# Patient Record
Sex: Female | Born: 1975 | Race: White | Hispanic: No | Marital: Married | Smoking: Never smoker
Health system: Southern US, Community
[De-identification: ages and names within clinical notes are randomized; demographics above are authoritative.]

## PROBLEM LIST (undated history)

## (undated) ENCOUNTER — Inpatient Hospital Stay (HOSPITAL_COMMUNITY): Payer: 59

## (undated) DIAGNOSIS — R61 Generalized hyperhidrosis: Secondary | ICD-10-CM

## (undated) DIAGNOSIS — G5603 Carpal tunnel syndrome, bilateral upper limbs: Secondary | ICD-10-CM

## (undated) DIAGNOSIS — O09529 Supervision of elderly multigravida, unspecified trimester: Secondary | ICD-10-CM

## (undated) DIAGNOSIS — F909 Attention-deficit hyperactivity disorder, unspecified type: Secondary | ICD-10-CM

## (undated) DIAGNOSIS — B009 Herpesviral infection, unspecified: Secondary | ICD-10-CM

## (undated) DIAGNOSIS — T7840XA Allergy, unspecified, initial encounter: Secondary | ICD-10-CM

## (undated) HISTORY — DX: Allergy, unspecified, initial encounter: T78.40XA

## (undated) HISTORY — DX: Herpesviral infection, unspecified: B00.9

## (undated) HISTORY — DX: Attention-deficit hyperactivity disorder, unspecified type: F90.9

## (undated) HISTORY — DX: Generalized hyperhidrosis: R61

## (undated) HISTORY — DX: Supervision of elderly multigravida, unspecified trimester: O09.529

## (undated) HISTORY — PX: RHINOPLASTY: SUR1284

---

## 2003-07-02 ENCOUNTER — Emergency Department (HOSPITAL_COMMUNITY): Admission: EM | Admit: 2003-07-02 | Discharge: 2003-07-02 | Payer: Self-pay | Admitting: Emergency Medicine

## 2003-09-12 ENCOUNTER — Other Ambulatory Visit: Admission: RE | Admit: 2003-09-12 | Discharge: 2003-09-12 | Payer: Self-pay | Admitting: Obstetrics and Gynecology

## 2004-10-06 ENCOUNTER — Other Ambulatory Visit: Admission: RE | Admit: 2004-10-06 | Discharge: 2004-10-06 | Payer: Self-pay | Admitting: Obstetrics and Gynecology

## 2004-10-08 ENCOUNTER — Encounter: Admission: RE | Admit: 2004-10-08 | Discharge: 2004-10-08 | Payer: Self-pay | Admitting: Obstetrics and Gynecology

## 2005-11-17 ENCOUNTER — Ambulatory Visit: Payer: Self-pay | Admitting: Family Medicine

## 2006-02-17 ENCOUNTER — Ambulatory Visit: Payer: Self-pay | Admitting: Family Medicine

## 2006-06-17 ENCOUNTER — Ambulatory Visit: Payer: Self-pay | Admitting: Family Medicine

## 2006-12-29 ENCOUNTER — Ambulatory Visit: Payer: Self-pay | Admitting: Family Medicine

## 2007-04-11 ENCOUNTER — Ambulatory Visit: Payer: Self-pay | Admitting: Family Medicine

## 2007-06-28 ENCOUNTER — Ambulatory Visit: Payer: Self-pay | Admitting: Family Medicine

## 2008-01-30 ENCOUNTER — Ambulatory Visit: Payer: Self-pay | Admitting: Family Medicine

## 2008-08-02 ENCOUNTER — Ambulatory Visit: Payer: Self-pay | Admitting: Family Medicine

## 2009-05-27 ENCOUNTER — Ambulatory Visit: Payer: Self-pay | Admitting: Family Medicine

## 2009-06-20 ENCOUNTER — Emergency Department (HOSPITAL_COMMUNITY): Admission: EM | Admit: 2009-06-20 | Discharge: 2009-06-20 | Payer: Self-pay | Admitting: Emergency Medicine

## 2009-10-23 ENCOUNTER — Ambulatory Visit: Payer: Self-pay | Admitting: Family Medicine

## 2010-04-21 ENCOUNTER — Ambulatory Visit: Payer: Self-pay | Admitting: Family Medicine

## 2010-11-14 ENCOUNTER — Telehealth: Payer: Self-pay | Admitting: Family Medicine

## 2010-11-17 MED ORDER — METHYLPHENIDATE HCL ER (OSM) 36 MG PO TBCR: 36.0000 mg | EXTENDED_RELEASE_TABLET | Freq: Every day | ORAL | Status: DC

## 2010-11-17 MED ORDER — METHYLPHENIDATE HCL ER (LA) 20 MG PO CP24: 20.0000 mg | ORAL_CAPSULE | Freq: Every day | ORAL | Status: DC

## 2010-11-17 MED ORDER — CLONAZEPAM 0.5 MG PO TABS: 0.5000 mg | ORAL_TABLET | Freq: Two times a day (BID) | ORAL | Status: DC

## 2010-11-17 NOTE — Telephone Encounter (Signed)
Her ADD meds plus Klonopin were called in.

## 2011-02-19 ENCOUNTER — Encounter: Payer: Self-pay | Admitting: Family Medicine

## 2011-03-03 ENCOUNTER — Telehealth: Payer: Self-pay | Admitting: *Deleted

## 2011-03-03 NOTE — Telephone Encounter (Signed)
Called pt to inform her she needs an appointment

## 2011-03-03 NOTE — Telephone Encounter (Signed)
She needs a follow-up appointment 

## 2011-03-09 ENCOUNTER — Encounter: Payer: Self-pay | Admitting: Family Medicine

## 2011-03-09 ENCOUNTER — Ambulatory Visit (INDEPENDENT_AMBULATORY_CARE_PROVIDER_SITE_OTHER): Payer: BC Managed Care – PPO | Admitting: Family Medicine

## 2011-03-09 VITALS — BP 100/60 | HR 64 | Wt 144.0 lb

## 2011-03-09 DIAGNOSIS — F909 Attention-deficit hyperactivity disorder, unspecified type: Secondary | ICD-10-CM

## 2011-03-09 DIAGNOSIS — M542 Cervicalgia: Secondary | ICD-10-CM

## 2011-03-09 MED ORDER — METHYLPHENIDATE HCL ER (LA) 20 MG PO CP24: 20.0000 mg | ORAL_CAPSULE | Freq: Every day | ORAL | Status: DC

## 2011-03-09 MED ORDER — METHYLPHENIDATE HCL ER (OSM) 36 MG PO TBCR: 36.0000 mg | EXTENDED_RELEASE_TABLET | Freq: Every day | ORAL | Status: DC

## 2011-03-09 MED ORDER — METHYLPHENIDATE HCL 20 MG PO TABS: 20.0000 mg | ORAL_TABLET | Freq: Every day | ORAL | Status: DC

## 2011-03-09 NOTE — Patient Instructions (Signed)
Check on the Ritalin and Concerta concerning the pregnancy category risk

## 2011-03-09 NOTE — Progress Notes (Signed)
  Subjective:    Patient ID: Alexandria Hanna, female    DOB: 1976-02-01, 35 y.o.   MRN: [id number]  HPI She is here for consult concerning her ADD. She continues to do nicely on her Concerta and then uses Ritalin as needed for later on in the day she needs to concentrate. She also is having left neck pain and into the shoulder. It is not made worse with any particular activities. Head motion makes no difference. No numbness, tingling or weakness. Also this weekend she is going to get married. She did ask questions and in her medication and pregnancy.  Review of Systems     Objective:   Physical Exam Alert and in no distress. Full motion of the neck. No tenderness to palpation of the neck. Normal motor, sensory and DTRs of her arms.       Assessment & Plan:   1. ADD (attention deficit disorder with hyperactivity)   2. Neck pain    Prescriptions written for 90 day supply of Ritalin and Concerta did also recommend heat stretching and massage for her neck.

## 2011-03-10 ENCOUNTER — Telehealth: Payer: Self-pay | Admitting: Family Medicine

## 2011-03-10 ENCOUNTER — Other Ambulatory Visit: Payer: Self-pay

## 2011-03-10 MED ORDER — CLONAZEPAM 0.5 MG PO TABS: 0.5000 mg | ORAL_TABLET | Freq: Two times a day (BID) | ORAL | Status: DC

## 2011-03-10 NOTE — Telephone Encounter (Signed)
Call in the Klonopin

## 2011-03-10 NOTE — Telephone Encounter (Signed)
Ordered med per jcl 

## 2011-07-01 ENCOUNTER — Telehealth: Payer: Self-pay | Admitting: Internal Medicine

## 2011-07-01 MED ORDER — METHYLPHENIDATE HCL 20 MG PO TABS: 20.0000 mg | ORAL_TABLET | Freq: Every day | ORAL | Status: DC

## 2011-07-01 MED ORDER — CLONAZEPAM 0.5 MG PO TABS: 0.5000 mg | ORAL_TABLET | Freq: Two times a day (BID) | ORAL | Status: DC

## 2011-07-01 MED ORDER — METHYLPHENIDATE HCL ER (OSM) 36 MG PO TBCR: 36.0000 mg | EXTENDED_RELEASE_TABLET | ORAL | Status: DC

## 2011-07-01 NOTE — Telephone Encounter (Signed)
Ritalin, Concerta and Klonopin were renewed

## 2011-07-02 ENCOUNTER — Telehealth: Payer: Self-pay | Admitting: Family Medicine

## 2011-07-02 NOTE — Telephone Encounter (Signed)
TSD  

## 2011-10-26 ENCOUNTER — Encounter (HOSPITAL_COMMUNITY): Payer: Self-pay

## 2011-10-26 ENCOUNTER — Encounter (HOSPITAL_COMMUNITY): Admission: AD | Disposition: A | Discharge status: Home / Self Care | Payer: Self-pay | Source: Ambulatory Visit

## 2011-10-26 ENCOUNTER — Inpatient Hospital Stay (HOSPITAL_COMMUNITY)
Admission: AD | Admit: 2011-10-26 | Discharge: 2011-10-26 | Disposition: A | Discharge status: Home / Self Care | Payer: 59 | Source: Ambulatory Visit | Attending: Obstetrics and Gynecology | Admitting: Obstetrics and Gynecology

## 2011-10-26 ENCOUNTER — Encounter (HOSPITAL_COMMUNITY): Payer: Self-pay | Admitting: *Deleted

## 2011-10-26 ENCOUNTER — Inpatient Hospital Stay (HOSPITAL_COMMUNITY): Payer: 59

## 2011-10-26 DIAGNOSIS — F909 Attention-deficit hyperactivity disorder, unspecified type: Secondary | ICD-10-CM

## 2011-10-26 DIAGNOSIS — O039 Complete or unspecified spontaneous abortion without complication: Secondary | ICD-10-CM

## 2011-10-26 DIAGNOSIS — O021 Missed abortion: Secondary | ICD-10-CM | POA: Insufficient documentation

## 2011-10-26 HISTORY — PX: DILATION AND EVACUATION: SHX1459

## 2011-10-26 LAB — CBC
MCHC: 34.9 g/dL (ref 30.0–36.0)
RDW: 11.9 % (ref 11.5–15.5)

## 2011-10-26 LAB — ABO/RH: ABO/RH(D): B NEG

## 2011-10-26 SURGERY — DILATION AND EVACUATION, UTERUS
Anesthesia: General

## 2011-10-26 MED ORDER — OXYCODONE-ACETAMINOPHEN 5-500 MG PO CAPS: 1.0000 | ORAL_CAPSULE | ORAL | Status: AC | PRN

## 2011-10-26 MED ORDER — PROPOFOL 10 MG/ML IV EMUL
INTRAVENOUS | Status: DC | PRN
  Administered 2011-10-26: 20 mg via INTRAVENOUS
  Administered 2011-10-26 (×2): 30 mg via INTRAVENOUS
  Administered 2011-10-26: 20 mg via INTRAVENOUS
  Administered 2011-10-26: 10 mg via INTRAVENOUS
  Administered 2011-10-26: 30 mg via INTRAVENOUS

## 2011-10-26 MED ORDER — KETOROLAC TROMETHAMINE 30 MG/ML IJ SOLN
INTRAMUSCULAR | Status: DC | PRN
  Administered 2011-10-26: 30 mg via INTRAVENOUS

## 2011-10-26 MED ORDER — AMOXICILLIN-POT CLAVULANATE 875-125 MG PO TABS: 1.0000 | ORAL_TABLET | Freq: Two times a day (BID) | ORAL | Status: AC

## 2011-10-26 MED ORDER — ALBUTEROL SULFATE (5 MG/ML) 0.5% IN NEBU: 2.5000 mg | INHALATION_SOLUTION | Freq: Once | RESPIRATORY_TRACT | Status: DC

## 2011-10-26 MED ORDER — CEFAZOLIN SODIUM 1-5 GM-% IV SOLN
1.0000 g | INTRAVENOUS | Status: AC
  Administered 2011-10-26: 1 g via INTRAVENOUS
  Filled 2011-10-26: qty 50

## 2011-10-26 MED ORDER — ALBUTEROL SULFATE HFA 108 (90 BASE) MCG/ACT IN AERS
2.0000 | INHALATION_SPRAY | Freq: Once | RESPIRATORY_TRACT | Status: AC
  Administered 2011-10-26: 2 via RESPIRATORY_TRACT
  Filled 2011-10-26: qty 6.7

## 2011-10-26 MED ORDER — LACTATED RINGERS IV SOLN
INTRAVENOUS | Status: DC
  Administered 2011-10-26: 19:00:00 via INTRAVENOUS

## 2011-10-26 MED ORDER — ONDANSETRON HCL 4 MG/2ML IJ SOLN
INTRAMUSCULAR | Status: DC | PRN
  Administered 2011-10-26: 4 mg via INTRAVENOUS

## 2011-10-26 MED ORDER — MIDAZOLAM HCL 5 MG/5ML IJ SOLN
INTRAMUSCULAR | Status: DC | PRN
  Administered 2011-10-26 (×2): 2 mg via INTRAVENOUS

## 2011-10-26 MED ORDER — LIDOCAINE HCL (CARDIAC) 20 MG/ML IV SOLN
INTRAVENOUS | Status: DC | PRN
  Administered 2011-10-26: 30 mg via INTRAVENOUS

## 2011-10-26 MED ORDER — FENTANYL CITRATE 0.05 MG/ML IJ SOLN
INTRAMUSCULAR | Status: DC | PRN
  Administered 2011-10-26: 100 ug via INTRAVENOUS

## 2011-10-26 MED ORDER — RHO D IMMUNE GLOBULIN 1500 UNIT/2ML IJ SOLN
300.0000 ug | Freq: Once | INTRAMUSCULAR | Status: AC
  Administered 2011-10-26: 300 ug via INTRAMUSCULAR

## 2011-10-26 MED ORDER — CITRIC ACID-SODIUM CITRATE 334-500 MG/5ML PO SOLN
30.0000 mL | Freq: Once | ORAL | Status: AC
  Administered 2011-10-26: 30 mL via ORAL
  Filled 2011-10-26: qty 15

## 2011-10-26 MED ORDER — FAMOTIDINE IN NACL 20-0.9 MG/50ML-% IV SOLN
20.0000 mg | Freq: Once | INTRAVENOUS | Status: AC
  Administered 2011-10-26: 20 mg via INTRAVENOUS
  Filled 2011-10-26: qty 50

## 2011-10-26 MED ORDER — FENTANYL CITRATE 0.05 MG/ML IJ SOLN: 25.0000 ug | INTRAMUSCULAR | Status: DC | PRN

## 2011-10-26 MED ORDER — CHLOROPROCAINE HCL 1 % IJ SOLN
INTRAMUSCULAR | Status: DC | PRN
  Administered 2011-10-26: 18 mL

## 2011-10-26 SURGICAL SUPPLY — 18 items
CLOTH BEACON ORANGE TIMEOUT ST (SAFETY) ×2 IMPLANT
DECANTER SPIKE VIAL GLASS SM (MISCELLANEOUS) ×2 IMPLANT
GLOVE BIO SURGEON STRL SZ7 (GLOVE) ×2 IMPLANT
GOWN PREVENTION PLUS LG XLONG (DISPOSABLE) ×2 IMPLANT
GOWN STRL REIN XL XLG (GOWN DISPOSABLE) ×2 IMPLANT
KIT BERKELEY 1ST TRIMESTER 3/8 (MISCELLANEOUS) ×2 IMPLANT
NDL SPNL 20GX3.5 QUINCKE YW (NEEDLE) ×1 IMPLANT
NEEDLE SPNL 20GX3.5 QUINCKE YW (NEEDLE) ×2 IMPLANT
NS IRRIG 1000ML POUR BTL (IV SOLUTION) ×2 IMPLANT
PACK VAGINAL MINOR WOMEN LF (CUSTOM PROCEDURE TRAY) ×2 IMPLANT
PAD PREP 24X48 CUFFED NSTRL (MISCELLANEOUS) ×2 IMPLANT
SET BERKELEY SUCTION TUBING (SUCTIONS) ×2 IMPLANT
SYR CONTROL 10ML LL (SYRINGE) ×2 IMPLANT
TOWEL OR 17X24 6PK STRL BLUE (TOWEL DISPOSABLE) ×4 IMPLANT
VACURETTE 10 RIGID CVD (CANNULA) IMPLANT
VACURETTE 7MM CVD STRL WRAP (CANNULA) IMPLANT
VACURETTE 8 RIGID CVD (CANNULA) IMPLANT
VACURETTE 9 RIGID CVD (CANNULA) IMPLANT

## 2011-10-26 NOTE — Anesthesia Preprocedure Evaluation (Addendum)
Anesthesia Evaluation  Patient identified by MRN, date of birth, ID band Patient awake    Reviewed: Allergy & Precautions, H&P , Patient's Chart, lab work & pertinent test results, reviewed documented beta blocker date and time   Airway Mallampati: II TM Distance: >3 FB Neck ROM: full    Dental No notable dental hx.    Pulmonary  breath sounds clear to auscultation  Pulmonary exam normal       Cardiovascular Rhythm:regular Rate:Normal     Neuro/Psych PSYCHIATRIC DISORDERS    GI/Hepatic   Endo/Other    Renal/GU      Musculoskeletal   Abdominal   Peds  Hematology   Anesthesia Other Findings   Reproductive/Obstetrics                          Anesthesia Physical Anesthesia Plan  ASA: II  Anesthesia Plan: MAC   Post-op Pain Management:    Induction: Intravenous  Airway Management Planned: Mask, Simple Face Mask and Natural Airway  Additional Equipment:   Intra-op Plan:   Post-operative Plan:   Informed Consent: I have reviewed the patients History and Physical, chart, labs and discussed the procedure including the risks, benefits and alternatives for the proposed anesthesia with the patient or authorized representative who has indicated his/her understanding and acceptance.   Dental Advisory Given  Plan Discussed with: CRNA and Surgeon  Anesthesia Plan Comments: (  Discussed MAC anesthesia, including possible nausea, instrumentation of airway, sore throat,pulmonary aspiration, etc. I asked if the were any outstanding questions, or  concerns before we proceeded. )       Anesthesia Quick Evaluation

## 2011-10-26 NOTE — Brief Op Note (Signed)
10/26/2011  8:28 PM  PATIENT:  Alexandria Hanna  36 y.o. female  PRE-OPERATIVE DIAGNOSIS:  Missed Abortion 9 wks  POST-OPERATIVE DIAGNOSIS:  * No post-op diagnosis entered *  PROCEDURE:  Procedure(s) (LRB): DILATATION AND EVACUATION (N/A)  SURGEON:  Surgeon(s) and Role:    * Juluis Mire, MD - Primary  PHYSICIAN ASSISTANT:   ASSISTANTS: none   ANESTHESIA:   local and IV sedation  EBL:     BLOOD ADMINISTERED:none  DRAINS: none   LOCAL MEDICATIONS USED:  OTHER nesicaine 18 cc  SPECIMEN:  Source of Specimen:  POC  DISPOSITION OF SPECIMEN:  PATHOLOGY  COUNTS:  YES  TOURNIQUET:  * No tourniquets in log *  DICTATION: .Other Dictation: Dictation Number [id number]  PLAN OF CARE: Discharge to home after PACU  PATIENT DISPOSITION:  PACU - hemodynamically stable.   Delay start of Pharmacological VTE agent (>24hrs) due to surgical blood loss or risk of bleeding: no

## 2011-10-26 NOTE — MAU Note (Signed)
Pt went to the doctor today due to bleeding that started Friday at 8pm. Pt states she has bleed throughout the weekend today bleeding being heavier. Pt had ultrasound may 2nd, 9th and today with confirmed no heart rate. Pt was sent home and was told to come to the hospital if bleeding and pain became worse. Now bleeding is smears on perimeum. Pain is a level 8 or 9.

## 2011-10-26 NOTE — MAU Note (Signed)
Warm blankets provided to pt.

## 2011-10-26 NOTE — MAU Note (Signed)
Patient states she is having a miscarriage. Was in the office today and had som POC still in the uterus. Was instructed if bleeding continued and pain got worse may need a D & C. Patient states she has had increasing bleeding with clots and increase in the pain.

## 2011-10-26 NOTE — MAU Note (Signed)
TO OR VIA STRETCHER.

## 2011-10-26 NOTE — Op Note (Signed)
Patient name  Alexandria Hanna, Alexandria Hanna DICTATION#[id number] CSN# [id number]   Juluis Mire, MD 10/26/2011 8:30 PM

## 2011-10-26 NOTE — Anesthesia Postprocedure Evaluation (Signed)
  Anesthesia Post-op Note  Patient: Alexandria Hanna  Procedure(s) Performed: Procedure(s) (LRB): DILATATION AND EVACUATION (N/A)  Patient is awake and responsive. Pain and nausea are reasonably well controlled. Vital signs are stable and clinically acceptable. Oxygen saturation is clinically acceptable. There are no apparent anesthetic complications at this time. Patient is ready for discharge.   

## 2011-10-26 NOTE — Transfer of Care (Signed)
Immediate Anesthesia Transfer of Care Note  Patient: Alexandria Hanna  Procedure(s) Performed: Procedure(s) (LRB): DILATATION AND EVACUATION (N/A)  Patient Location: PACU  Anesthesia Type: MAC  Level of Consciousness: awake, alert , oriented and patient cooperative  Airway & Oxygen Therapy: Patient Spontanous Breathing  Post-op Assessment: Report given to PACU RN and Post -op Vital signs reviewed and stable  Post vital signs: Reviewed and stable  Complications: No apparent anesthesia complications

## 2011-10-26 NOTE — Discharge Instructions (Signed)
Dilation and Curettage or Vacuum Curettage  Care After  Refer to this sheet in the next few weeks. These instructions provide you with general information on caring for yourself after your procedure. Your caregiver may also give you more specific instructions. Your treatment has been planned according to current medical practices, but problems sometimes occur. Call your caregiver if you have any problems or questions after your procedure.  HOME CARE INSTRUCTIONS   · Do not drive for 24 hours.  · Wait 1 week before returning to strenuous activities.  · Take your temperature 2 times a day for 4 days and write it down. Provide these temperatures to your caregiver if you develop a fever.  · Avoid long periods of standing, and do no heavy lifting (more than 10 pounds or 4.5 kg), pushing, or pulling.  · Limit stair climbing to once or twice a day.  · Take rest periods often.  · You may resume your usual diet.  · Drink enough fluids to keep your urine clear or pale yellow.  · You should return to your usual bowel function. If constipation should occur, you may:  · Take a mild laxative with permission from your caregiver.  · Add fruit and bran to your diet.  · Drink more fluids.  · Take showers instead of baths until your caregiver gives you permission to take baths.  · Do not go swimming or use a hot tub until your caregiver gives you permission.  · Try to have someone with you or available to you the first 24 to 48 hours, especially if you had a general anesthetic.  · Do not douche, use tampons, or have intercourse until after your follow-up appointment, or when your caregiver approves.  · Only take over-the-counter or prescription medicines for pain, discomfort, or fever as directed by your caregiver. Do not take aspirin. It can cause bleeding.  · If a prescription was given, follow your caregiver's directions.  · Keep all your follow-up appointments recommended by your caregiver.  SEEK MEDICAL CARE IF:   · You have  increasing cramps or pain not relieved with medicine.  · You have abdominal pain which does not seem to be related to the same area of earlier cramping and pain.  · You have bad smelling vaginal discharge.  · You have a rash.  · You have problems with any medicine.  SEEK IMMEDIATE MEDICAL CARE IF:   · You have bleeding that is heavier than a normal menstrual period.  · You have a fever.  · You have chest pain.  · You have shortness of breath.  · You feel dizzy or feel like fainting.  · You pass out.  · You have pain in your shoulder strap area.  · You have heavy vaginal bleeding with or without blood clots.  MAKE SURE YOU:   · Understand these instructions.  · Will watch your condition.  · Will get help right away if you are not doing well or get worse.  Document Released: 05/22/2000 Document Revised: 05/14/2011 Document Reviewed: 12/20/2008  Exit[employer]® Patient Information ©2012 [employer].

## 2011-10-26 NOTE — H&P (Signed)
Alexandria Hanna is an 36 y.o. female. At 9 weeks   Seen today in office for vaginal bleeding. sono with nonviable first trimester pregnancy. Presents with increased bleeding.  Pertinent Gynecological History: Menses: regular every month without intermenstrual spotting Bleeding: naontraception: none DES exposure: denies Blood transfusions: none Sexually transmitted diseases: no past history Previous GYN Procedures: none  Last mammogram: na OB History: G2 P0  Menstrual History: Menarche age: 1Patient's last menstrual period was 08/19/2011.    Past Medical History  Diagnosis Date  . ADHD (attention deficit hyperactivity disorder)   . Hyperhydrosis disorder   . Allergy     RHINITIS  . Asthma     No past surgical history on file.  No family history on file.  Social History:  reports that she has never smoked. She has never used smokeless tobacco. Her alcohol and drug histories not on file.  Allergies:  Allergies  Allergen Reactions  . Polysporin (Bacitracin-Polymyxin B) Itching and Rash    Prescriptions prior to admission  Medication Sig Dispense Refill  . albuterol (PROVENTIL) (2.5 MG/3ML) 0.083% nebulizer solution Take 2.5 mg by nebulization every 6 (six) hours as needed.        . clonazePAM (KLONOPIN) 0.5 MG tablet Take 1 tablet (0.5 mg total) by mouth 2 (two) times daily.  60 tablet  1  . methylphenidate (RITALIN) 20 MG tablet Take 1 tablet (20 mg total) by mouth daily.  90 tablet  0  . Prenatal Vit-Fe Fumarate-FA (PRENATAL MULTIVITAMIN) TABS Take 1 tablet by mouth at bedtime.      . methylphenidate (CONCERTA) 36 MG CR tablet Take 1 tablet (36 mg total) by mouth every morning.  90 tablet  0    Review of Systems  All other systems reviewed and are negative.    Blood pressure 112/77, pulse 66, temperature 97.7 F (36.5 C), temperature source Oral, resp. rate 16, height 5\' 7"  (1.702 m), weight 67.132 kg (148 lb), last menstrual period 08/19/2011, SpO2 99.00%. Physical  Exam  Constitutional: She is oriented to person, place, and time. She appears well-developed and well-nourished.  Eyes: Pupils are equal, round, and reactive to light.  Cardiovascular: Normal rate, regular rhythm and normal heart sounds.   Respiratory: Effort normal and breath sounds normal.  GI: Soft. Bowel sounds are normal. There is no tenderness.  Genitourinary:       Moderate vaginal bleeding  Possible POC  Neurological: She is alert and oriented to person, place, and time. She has normal reflexes.    No results found for this or any previous visit (from the past 24 hour(s)).  No results found.  Assessment/Plan: Nonviable first trimester pregnancy.  Proceed with dilation and evacuation. Risk of dilation and curettage are discussed. This includes the risk of infection. Risk of hemorrhage that could require transfusion with the risk of AIDS or hepatitis. Excessive bleeding could require hysterectomy. Risk of retained products of conception require repeat dilation and evacuation. Risk of uterine perforation leading to injury to adjacent organs requiring diagnostic laparoscopy and possible exploratory laparotomy. Risk of deep venous ecchymosis and pulmonary embolus. Patient expresses understanding potential risk and complications.   Oumar Marcott S 10/26/2011, 6:57 PM

## 2011-10-26 NOTE — MAU Provider Note (Signed)
Alexandria Hanna y.o.G2P0010 @[redacted]w[redacted]d  Chief Complaint  Patient presents with  . Abdominal Pain  . Vaginal Bleeding     First Provider Initiated Contact with Patient 10/26/11 1830      SUBJECTIVE  HPI: Pt presents to MAU with heavy vaginal bleeding.  She has been having light bleeding over the weekend and was seen in [facility] office today and had no fetal heartbeat.  Dr Arelia Sneddon told her to come into MAU if bleeding or pain increased.  She denies LOF, vaginal itching/burning, urinary symptoms, h/a, dizziness, n/v, or fever/chills.    Patient's last menstrual period was 08/19/2011.  Past Medical History  Diagnosis Date  . ADHD (attention deficit hyperactivity disorder)   . Hyperhydrosis disorder   . Allergy     RHINITIS  . Asthma    No past surgical history on file. History   Social History  . Marital Status: Single    Spouse Name: N/A    Number of Children: N/A  . Years of Education: N/A   Occupational History  . Not on file.   Social History Main Topics  . Smoking status: Never Smoker   . Smokeless tobacco: Never Used  . Alcohol Use: Not on file  . Drug Use: Not on file  . Sexually Active: Not on file   Other Topics Concern  . Not on file   Social History Narrative  . No narrative on file   No current facility-administered medications on file prior to encounter.   Current Outpatient Prescriptions on File Prior to Encounter  Medication Sig Dispense Refill  . albuterol (PROVENTIL) (2.5 MG/3ML) 0.083% nebulizer solution Take 2.5 mg by nebulization every 6 (six) hours as needed.        . clonazePAM (KLONOPIN) 0.5 MG tablet Take 1 tablet (0.5 mg total) by mouth 2 (two) times daily.  60 tablet  1  . methylphenidate (RITALIN) 20 MG tablet Take 1 tablet (20 mg total) by mouth daily.  90 tablet  0  . methylphenidate (CONCERTA) 36 MG CR tablet Take 1 tablet (36 mg total) by mouth every morning.  90 tablet  0   Allergies  Allergen Reactions  . Polysporin  (Bacitracin-Polymyxin B) Itching and Rash    ROS: Pertinent items in HPI  OBJECTIVE Blood pressure 112/77, pulse 66, temperature 97.7 F (36.5 C), temperature source Oral, resp. rate 16, height 5\' 7"  (1.702 m), weight 67.132 kg (148 lb), last menstrual period 08/19/2011, SpO2 99.00%.  GENERAL: Well-developed, well-nourished female in no acute distress.  HEENT: Normocephalic, good dentition HEART: normal rate RESP: normal effort ABDOMEN: Soft, nontender EXTREMITIES: Nontender, no edema NEURO: Alert and oriented Pelvic exam: Cervix pink, visually slightly open, without lesion, large amount bright red vaginal bleeding with clots, large clot/tissue removed from cervical os using ring forceps Bimanual exam: Cervix ft/long/high, soft, anterior, uterus tender upon palpation, slightly enlarged   ASSESSMENT Failed IUP @[redacted]w[redacted]d   PLAN IV LR CBC ordered Care assumed by Dr Arelia Sneddon at 6:50 pm  [facility], [facility] 10/26/2011 6:37 PM

## 2011-10-27 ENCOUNTER — Encounter (HOSPITAL_COMMUNITY): Payer: Self-pay | Admitting: Obstetrics and Gynecology

## 2011-10-27 LAB — RH IG WORKUP (INCLUDES ABO/RH)
ABO/RH(D): B NEG
Antibody Screen: NEGATIVE
Gestational Age(Wks): 9

## 2011-10-27 NOTE — Op Note (Signed)
NAMENANIE, DUNKLEBERGER               ACCOUNT NO.:  [id number]  MEDICAL RECORD NO.:  [id number]  LOCATION:  [facility]                          FACILITY:  [facility]  PHYSICIAN:  Juluis Mire, M.D.   DATE OF BIRTH:  [birth year]  DATE OF PROCEDURE:  10/26/2011 DATE OF DISCHARGE:  10/26/2011                              OPERATIVE REPORT   PREOPERATIVE DIAGNOSIS:  Nonviable first trimester pregnancy.  POSTOPERATIVE DIAGNOSIS:  Nonviable first trimester pregnancy.  PROCEDURES:  Paracervical block, cervical dilatation with evacuation.  SURGEON:  Juluis Mire, MD.  ANESTHESIA:  Sedation with paracervical block.  BLOOD LOSS:  Minimal.  PACKS AND DRAINS:  None.  INTRAOPERATIVE BLOOD PLACED:  None.  COMPLICATIONS:  None.  INDICATION:  As written in H and P.  PROCEDURE IN DETAIL:  The patient was taken to the OR and placed in supine position.  After sedation was obtained, the patient was placed in dorsal lithotomy position using the Allen stirrups.  The patient was draped as a sterile field.  A speculum was placed in the vaginal vault. The cervix was cleansed with Betadine.  Anterior lip of the cervix was anesthetized with 1% Nesacaine.  A cervix was secured with a single- tooth tenaculum.  Paracervical block using 1% Nesacaine was instituted. Approximately 18 mL were used.  Uterus sounded to approximately 9-10 cm. Cervix was serially dilated to a size 27 Pratt dilator, size 8 suction curette was introduced and intrauterine cavity was evacuated using suction curetting.  We did not obtain a lot of tissue.  We repeated the suction and curetting and no tissue was obtained.  The uterus was felt to be contracting down well.  We curetted and we felt that all quadrants were cleared by gritty field.  Repeat suction and curetting, and again no additional tissue was obtained, and the uterus appeared to be contracting down well.  Bimanual exam revealed the uterus basically returning to approximately 8  weeks in size.  Adnexa unremarkable.  She had no active bleeding or signs of perforation.  The single-tooth tenaculum and speculum then removed.  The patient was taken out of the dorsal lithotomy position.  Once alert, transferred to recovery room in good condition.  Sponge, instrument, and needle count was reported as correct by circulating nurse.     Juluis Mire, M.D.     JSM/MEDQ  D:  10/26/2011  T:  10/27/2011  Job:  [id number]

## 2011-10-27 NOTE — Anesthesia Postprocedure Evaluation (Signed)
  Anesthesia Post-op Note  Patient: Alexandria Hanna  Procedure(s) Performed: Procedure(s) (LRB): DILATATION AND EVACUATION (N/A)  Patient is awake and responsive. Pain and nausea are reasonably well controlled. Vital signs are stable and clinically acceptable. Oxygen saturation is clinically acceptable. There are no apparent anesthetic complications at this time. Patient is ready for discharge.

## 2011-12-03 ENCOUNTER — Encounter: Payer: Self-pay | Admitting: Family Medicine

## 2011-12-03 ENCOUNTER — Ambulatory Visit (INDEPENDENT_AMBULATORY_CARE_PROVIDER_SITE_OTHER): Payer: 59 | Admitting: Family Medicine

## 2011-12-03 VITALS — BP 120/70 | HR 87 | Wt 143.0 lb

## 2011-12-03 DIAGNOSIS — S139XXA Sprain of joints and ligaments of unspecified parts of neck, initial encounter: Secondary | ICD-10-CM

## 2011-12-03 DIAGNOSIS — S161XXA Strain of muscle, fascia and tendon at neck level, initial encounter: Secondary | ICD-10-CM

## 2011-12-03 DIAGNOSIS — F909 Attention-deficit hyperactivity disorder, unspecified type: Secondary | ICD-10-CM

## 2011-12-03 MED ORDER — METHYLPHENIDATE HCL ER (OSM) 36 MG PO TBCR: 36.0000 mg | EXTENDED_RELEASE_TABLET | ORAL | Status: DC

## 2011-12-03 MED ORDER — METHYLPHENIDATE HCL 20 MG PO TABS: 20.0000 mg | ORAL_TABLET | Freq: Every day | ORAL | Status: DC
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
## 2011-12-03 NOTE — Patient Instructions (Addendum)
Heat for 20 minutes 3 times per day for your neck. Use 4 Advil 3 times per day. If you're totally back to normal after 2 weeks, call and cancel the appointment

## 2012-01-29 ENCOUNTER — Telehealth: Payer: Self-pay | Admitting: Internal Medicine

## 2012-01-29 DIAGNOSIS — F909 Attention-deficit hyperactivity disorder, unspecified type: Secondary | ICD-10-CM

## 2012-01-29 MED ORDER — METHYLPHENIDATE HCL 20 MG PO TABS: 20.0000 mg | ORAL_TABLET | Freq: Every day | ORAL | Status: DC

## 2012-01-29 MED ORDER — METHYLPHENIDATE HCL 20 MG PO TABS: 20.0000 mg | ORAL_TABLET | Freq: Two times a day (BID) | ORAL | Status: DC

## 2012-01-29 MED ORDER — METHYLPHENIDATE HCL ER (OSM) 36 MG PO TBCR: 36.0000 mg | EXTENDED_RELEASE_TABLET | ORAL | Status: DC

## 2012-01-29 NOTE — Telephone Encounter (Signed)
Ritalin and Concerta Rx's written

## 2012-02-01 ENCOUNTER — Telehealth: Payer: Self-pay | Admitting: Family Medicine

## 2012-02-01 NOTE — Telephone Encounter (Signed)
LM

## 2012-02-25 ENCOUNTER — Telehealth: Payer: Self-pay | Admitting: Internal Medicine

## 2012-02-25 NOTE — Telephone Encounter (Signed)
Done sl 

## 2012-03-24 ENCOUNTER — Encounter (HOSPITAL_COMMUNITY): Payer: Self-pay

## 2012-06-15 ENCOUNTER — Telehealth: Payer: Self-pay | Admitting: Family Medicine

## 2012-06-15 NOTE — Telephone Encounter (Signed)
Called pt left message Dr.Lalonde wants her to come in apt To discuss

## 2012-06-15 NOTE — Telephone Encounter (Signed)
Have her set up an appointment so we can go over all these things

## 2012-06-21 ENCOUNTER — Encounter: Payer: Self-pay | Admitting: Family Medicine

## 2012-06-21 ENCOUNTER — Ambulatory Visit (INDEPENDENT_AMBULATORY_CARE_PROVIDER_SITE_OTHER): Payer: 59 | Admitting: Family Medicine

## 2012-06-21 VITALS — BP 120/80 | HR 88 | Wt 139.0 lb

## 2012-06-21 DIAGNOSIS — R61 Generalized hyperhidrosis: Secondary | ICD-10-CM | POA: Insufficient documentation

## 2012-06-21 DIAGNOSIS — B001 Herpesviral vesicular dermatitis: Secondary | ICD-10-CM

## 2012-06-21 DIAGNOSIS — F909 Attention-deficit hyperactivity disorder, unspecified type: Secondary | ICD-10-CM

## 2012-06-21 DIAGNOSIS — L74519 Primary focal hyperhidrosis, unspecified: Secondary | ICD-10-CM

## 2012-06-21 DIAGNOSIS — B009 Herpesviral infection, unspecified: Secondary | ICD-10-CM

## 2012-06-21 MED ORDER — METHYLPHENIDATE HCL ER (OSM) 36 MG PO TBCR: 36.0000 mg | EXTENDED_RELEASE_TABLET | ORAL | Status: DC

## 2012-06-21 MED ORDER — RITALIN 20 MG PO TABS: 20.0000 mg | ORAL_TABLET | ORAL | Status: DC

## 2012-06-21 MED ORDER — RITALIN 20 MG PO TABS: 20.0000 mg | ORAL_TABLET | Freq: Every day | ORAL | Status: DC

## 2012-06-21 MED ORDER — VALACYCLOVIR HCL 500 MG PO TABS: ORAL_TABLET | ORAL | Status: DC

## 2012-06-21 MED ORDER — ALUMINUM CHLORIDE 20 % EX SOLN: Freq: Every day | CUTANEOUS | Status: DC
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
## 2012-08-11 ENCOUNTER — Telehealth: Payer: Self-pay | Admitting: Internal Medicine

## 2012-08-11 NOTE — Telephone Encounter (Signed)
Faxed over medical records to [facility] [employer]

## 2012-08-25 LAB — OB RESULTS CONSOLE ABO/RH: RH Type: NEGATIVE

## 2012-08-25 LAB — OB RESULTS CONSOLE ANTIBODY SCREEN: Antibody Screen: NEGATIVE

## 2012-08-25 LAB — OB RESULTS CONSOLE RUBELLA ANTIBODY, IGM: Rubella: IMMUNE

## 2012-08-25 LAB — OB RESULTS CONSOLE HIV ANTIBODY (ROUTINE TESTING): HIV: NONREACTIVE

## 2012-08-25 LAB — OB RESULTS CONSOLE HEPATITIS B SURFACE ANTIGEN: Hepatitis B Surface Ag: NEGATIVE

## 2012-09-03 ENCOUNTER — Encounter (HOSPITAL_COMMUNITY): Payer: Self-pay | Admitting: Emergency Medicine

## 2012-09-03 ENCOUNTER — Emergency Department (HOSPITAL_COMMUNITY)
Admission: EM | Admit: 2012-09-03 | Discharge: 2012-09-03 | Disposition: A | Discharge status: Home / Self Care | Payer: 59 | Attending: Emergency Medicine | Admitting: Emergency Medicine

## 2012-09-03 DIAGNOSIS — E162 Hypoglycemia, unspecified: Secondary | ICD-10-CM | POA: Insufficient documentation

## 2012-09-03 DIAGNOSIS — R42 Dizziness and giddiness: Secondary | ICD-10-CM | POA: Insufficient documentation

## 2012-09-03 DIAGNOSIS — Z8659 Personal history of other mental and behavioral disorders: Secondary | ICD-10-CM | POA: Insufficient documentation

## 2012-09-03 DIAGNOSIS — S0101XA Laceration without foreign body of scalp, initial encounter: Secondary | ICD-10-CM

## 2012-09-03 DIAGNOSIS — Z872 Personal history of diseases of the skin and subcutaneous tissue: Secondary | ICD-10-CM | POA: Insufficient documentation

## 2012-09-03 DIAGNOSIS — W1809XA Striking against other object with subsequent fall, initial encounter: Secondary | ICD-10-CM | POA: Insufficient documentation

## 2012-09-03 DIAGNOSIS — Z79899 Other long term (current) drug therapy: Secondary | ICD-10-CM | POA: Insufficient documentation

## 2012-09-03 DIAGNOSIS — J45909 Unspecified asthma, uncomplicated: Secondary | ICD-10-CM | POA: Insufficient documentation

## 2012-09-03 DIAGNOSIS — Y9229 Other specified public building as the place of occurrence of the external cause: Secondary | ICD-10-CM | POA: Insufficient documentation

## 2012-09-03 DIAGNOSIS — S0100XA Unspecified open wound of scalp, initial encounter: Secondary | ICD-10-CM | POA: Insufficient documentation

## 2012-09-03 DIAGNOSIS — R55 Syncope and collapse: Secondary | ICD-10-CM

## 2012-09-03 DIAGNOSIS — R61 Generalized hyperhidrosis: Secondary | ICD-10-CM | POA: Insufficient documentation

## 2012-09-03 DIAGNOSIS — Y9301 Activity, walking, marching and hiking: Secondary | ICD-10-CM | POA: Insufficient documentation

## 2012-09-03 DIAGNOSIS — O9989 Other specified diseases and conditions complicating pregnancy, childbirth and the puerperium: Secondary | ICD-10-CM | POA: Insufficient documentation

## 2012-09-03 LAB — BASIC METABOLIC PANEL
CO2: 22 mEq/L (ref 19–32)
Calcium: 8.5 mg/dL (ref 8.4–10.5)
Creatinine, Ser: 0.59 mg/dL (ref 0.50–1.10)
GFR calc Af Amer: >90 mL/min (ref >90)
GFR calc non Af Amer: >90 mL/min (ref >90)
Sodium: 133 mEq/L — ABNORMAL LOW (ref 135–145)

## 2012-09-03 LAB — CBC WITH DIFFERENTIAL/PLATELET
Basophils Absolute: 0 10*3/uL (ref 0.0–0.1)
Basophils Relative: 0 % (ref 0–1)
Eosinophils Absolute: 0.1 10*3/uL (ref 0.0–0.7)
Eosinophils Relative: 1 % (ref 0–5)
Lymphocytes Relative: 22 % (ref 12–46)
MCHC: 37.4 g/dL — ABNORMAL HIGH (ref 30.0–36.0)
MCV: 90.9 fL (ref 78.0–100.0)
Platelets: 183 10*3/uL (ref 150–400)
RDW: 11.7 % (ref 11.5–15.5)
WBC: 8.3 10*3/uL (ref 4.0–10.5)

## 2012-09-03 LAB — GLUCOSE, CAPILLARY: Glucose-Capillary: 65 mg/dL — ABNORMAL LOW (ref 70–99)

## 2012-09-03 MED ORDER — SODIUM CHLORIDE 0.9 % IV BOLUS (SEPSIS)
1000.0000 mL | Freq: Once | INTRAVENOUS | Status: AC
  Administered 2012-09-03: 1000 mL via INTRAVENOUS

## 2012-09-03 NOTE — ED Notes (Signed)
Pt A&O not symptomatic; crackers and peanut butter as well as orange juice 8oz provided to patient. Report given to primary nurse

## 2012-09-03 NOTE — ED Provider Notes (Signed)
History     CSN: [id number]  Arrival date & time 09/03/12  1548   First MD Initiated Contact with Patient 09/03/12 1553     Chief complaint: Syncope HPI Patient presents to the emergency room after having a syncopal episode. She is [redacted] weeks pregnant. While walking in a store she started to feel lightheaded and diaphoretic. Patient felt like she was going to pass out. Tried to squat down but fell backwards and struck her head on the corner. Patient had a brief loss of consciousness. She denies any neck pain or headache currently. She denies any chest pain or abdominal pain. She has not had any trouble with fevers, vomiting, nausea or diarrhea. She has not had any issues with her pregnancy. She's not having any vaginal bleeding. Past Medical History  Diagnosis Date  . ADHD (attention deficit hyperactivity disorder)   . Hyperhydrosis disorder   . Allergy     RHINITIS  . Asthma     Past Surgical History  Procedure Laterality Date  . Dilation and evacuation  10/26/2011    Procedure: DILATATION AND EVACUATION;  Surgeon: Juluis Mire, MD;  Location: [facility];  Service: Gynecology;  Laterality: N/A;    No family history on file.  History  Substance Use Topics  . Smoking status: Never Smoker   . Smokeless tobacco: Never Used  . Alcohol Use: No    OB History   Grav Para Term Preterm Abortions TAB SAB Ect Mult Living   2 0 0 0 1 1 0 0 0 0       Review of Systems  Allergies  Polysporin  Home Medications   Current Outpatient Rx  Name  Route  Sig  Dispense  Refill  . albuterol (PROVENTIL) (2.5 MG/3ML) 0.083% nebulizer solution   Nebulization   Take 2.5 mg by nebulization every 6 (six) hours as needed.           . Prenatal Vit-Fe Fumarate-FA (PRENATAL MULTIVITAMIN) TABS   Oral   Take 1 tablet by mouth at bedtime.         Marland Kitchen EXPIRED: clonazePAM (KLONOPIN) 0.5 MG tablet   Oral   Take 1 tablet (0.5 mg total) by mouth 2 (two) times daily.   60 tablet   1     BP 115/66   Pulse 70  Temp(Src) 98.7 F (37.1 C) (Oral)  Resp 18  SpO2 98%  Physical Exam  Nursing note and vitals reviewed. Constitutional: She appears well-developed and well-nourished. No distress.  HENT:  Head: Normocephalic.  Right Ear: External ear normal.  Left Ear: External ear normal.  Proximally one to 2 cm scalp lesion posterior right occiput, no large hematoma, no step off  Eyes: Conjunctivae are normal. Right eye exhibits no discharge. Left eye exhibits no discharge. No scleral icterus.  Neck: Neck supple. No tracheal deviation present.  Cervical spine nontender  Cardiovascular: Normal rate, regular rhythm and intact distal pulses.   Pulmonary/Chest: Effort normal and breath sounds normal. No stridor. No respiratory distress. She has no wheezes. She has no rales.  Abdominal: Soft. Bowel sounds are normal. She exhibits no distension. There is no tenderness. There is no rebound and no guarding.  Musculoskeletal: She exhibits no edema and no tenderness.  Full range of motion all extremities without pain or discomfort  Neurological: She is alert. She has normal strength. No sensory deficit. Cranial nerve deficit:  no gross defecits noted. She exhibits normal muscle tone. She displays no seizure activity. Coordination normal.  Skin: Skin is warm and dry. No rash noted.  Psychiatric: She has a normal mood and affect.    ED Course  LACERATION REPAIR Date/Time: 09/03/2012 5:34 PM Performed by: Linwood Dibbles R Authorized by: Linwood Dibbles R Consent: Verbal consent obtained. Consent given by: patient Body area: head/neck Location details: scalp Laceration length: 1.5 cm Tendon involvement: none Nerve involvement: none Vascular damage: no Anesthesia: local infiltration Local anesthetic: lidocaine 1% with epinephrine Anesthetic total: 3 ml Patient sedated: no Preparation: Patient was prepped and draped in the usual sterile fashion. Irrigation solution: saline Irrigation method:  syringe Amount of cleaning: standard Skin closure: staples (3)   (including critical care time) EKG Normal sinus rhythm rate 68 Short PR interval Normal axis, normal ST-T wave is   Labs Reviewed  BASIC METABOLIC PANEL - Abnormal; Notable for the following:    Sodium 133 (*)    All other components within normal limits  GLUCOSE, CAPILLARY - Abnormal; Notable for the following:    Glucose-Capillary 65 (*)    All other components within normal limits  CBC WITH DIFFERENTIAL   No results found.   1. Syncope   2. Scalp laceration, initial encounter   3. Hypoglycemia       MDM  Patient had a syncopal episode. She has no complaints at this time. I suspect this could be related to a vasovagal episode with her pregnancy. It is also possible that her borderline blood sugar was a contributing factor.  The patient's scalp laceration was repaired. A low suspicion for significant head injury. I discussed the options of observation versus CT scanning the head. Considering her pregnancy, we discussed and decided to on the latter.  Pt is feeling better.  Ambulates without difficulty.      Celene Kras, MD 09/03/12 [phone]

## 2012-09-03 NOTE — ED Notes (Signed)
[id number]<JY> Expected date:09/03/12<BR> Expected time:<BR> Means of arrival:<BR> Comments:<BR> 37 female preg 14 weeks fell and hit head at [facility]

## 2012-09-03 NOTE — ED Notes (Signed)
Per EMS pt [employer] female that is [redacted] weeks pregnant became light headed and passed out hitting head on clothes rack. C/o of pain to head small amt of blood. Pt alert and oriented. No meds and no medical problems.

## 2013-02-07 LAB — OB RESULTS CONSOLE GBS: GBS: POSITIVE

## 2013-03-07 ENCOUNTER — Inpatient Hospital Stay (HOSPITAL_COMMUNITY)
Admission: AD | Admit: 2013-03-07 | Discharge: 2013-03-07 | Disposition: A | Discharge status: Home / Self Care | Payer: 59 | Source: Ambulatory Visit | Attending: Obstetrics and Gynecology | Admitting: Obstetrics and Gynecology

## 2013-03-07 ENCOUNTER — Encounter (HOSPITAL_COMMUNITY): Payer: Self-pay

## 2013-03-07 DIAGNOSIS — Z3689 Encounter for other specified antenatal screening: Secondary | ICD-10-CM

## 2013-03-07 DIAGNOSIS — O368390 Maternal care for abnormalities of the fetal heart rate or rhythm, unspecified trimester, not applicable or unspecified: Secondary | ICD-10-CM | POA: Insufficient documentation

## 2013-03-07 NOTE — MAU Provider Note (Signed)
HPI: Ms. Alexandria Hanna is a 37 y.o. female; G3P0020 at [redacted]w[redacted]d who was sent over from the office for an NST; The patient was told her baby's heart rate was low in the office. According to the patient she was told two different things regarding the BPP that was done in the office; one Dr told her she got 6/8 and another told her she got 8/8. She reports good fetal movement, denies LOF, vaginal bleeding, vaginal itching/burning, urinary symptoms, h/a, dizziness, n/v, or fever/chills.     Objective:  Filed Vitals:   03/07/13 1820  BP: 113/76  Pulse: 80  Resp: 16    GENERAL: Well-developed, well-nourished female in no acute distress.  HEENT: Normocephalic, atraumatic.   LUNGS: Effort normal HEART: Regular rate  SKIN: Warm, dry and without erythema PSYCH: Normal mood and affect  Fetal Tracing: Baseline: 120 bpm Variability: + Accelerations: 15X15 Decelerations:  Toco: no contractions   Consulted with Dr. Arelia Sneddon who reviewed the fetal tracing. Ok to discharge home  A: Reactive NST  P: Discharge home Pt to call office tomorrow to schedule NST for tomorrow in the office  Iona Hansen Rasch, NP 03/07/2013 6:35 PM

## 2013-03-10 ENCOUNTER — Telehealth (HOSPITAL_COMMUNITY): Payer: Self-pay | Admitting: *Deleted

## 2013-03-10 ENCOUNTER — Encounter (HOSPITAL_COMMUNITY): Payer: Self-pay | Admitting: *Deleted

## 2013-03-10 NOTE — Telephone Encounter (Signed)
Preadmission screen  

## 2013-03-14 ENCOUNTER — Inpatient Hospital Stay (HOSPITAL_COMMUNITY)
Admission: RE | Admit: 2013-03-14 | Discharge: 2013-03-18 | DRG: 766 | Disposition: A | Discharge status: Home / Self Care | Payer: 59 | Source: Ambulatory Visit | Attending: Obstetrics and Gynecology | Admitting: Obstetrics and Gynecology

## 2013-03-14 ENCOUNTER — Encounter (HOSPITAL_COMMUNITY): Payer: Self-pay

## 2013-03-14 DIAGNOSIS — O09529 Supervision of elderly multigravida, unspecified trimester: Secondary | ICD-10-CM | POA: Diagnosis present

## 2013-03-14 DIAGNOSIS — Z22330 Carrier of Group B streptococcus: Secondary | ICD-10-CM

## 2013-03-14 DIAGNOSIS — O99892 Other specified diseases and conditions complicating childbirth: Secondary | ICD-10-CM | POA: Diagnosis present

## 2013-03-14 DIAGNOSIS — O480 Post-term pregnancy: Principal | ICD-10-CM | POA: Diagnosis present

## 2013-03-14 DIAGNOSIS — O360990 Maternal care for other rhesus isoimmunization, unspecified trimester, not applicable or unspecified: Secondary | ICD-10-CM | POA: Diagnosis present

## 2013-03-14 HISTORY — DX: Carpal tunnel syndrome, bilateral upper limbs: G56.03

## 2013-03-14 LAB — CBC
HCT: 34.3 % — ABNORMAL LOW (ref 36.0–46.0)
Hemoglobin: 12.4 g/dL (ref 12.0–15.0)
MCH: 33.3 pg (ref 26.0–34.0)
MCHC: 36.2 g/dL — ABNORMAL HIGH (ref 30.0–36.0)
MCV: 92.2 fL (ref 78.0–100.0)
Platelets: 117 10*3/uL — ABNORMAL LOW (ref 150–400)
RBC: 3.72 MIL/uL — ABNORMAL LOW (ref 3.87–5.11)
WBC: 7.4 10*3/uL (ref 4.0–10.5)

## 2013-03-14 MED ORDER — LACTATED RINGERS IV SOLN
INTRAVENOUS | Status: DC
  Administered 2013-03-14 – 2013-03-15 (×2): via INTRAVENOUS

## 2013-03-14 MED ORDER — FLEET ENEMA 7-19 GM/118ML RE ENEM: 1.0000 | ENEMA | RECTAL | Status: DC | PRN

## 2013-03-14 MED ORDER — LIDOCAINE HCL (PF) 1 % IJ SOLN: 30.0000 mL | INTRAMUSCULAR | Status: DC | PRN

## 2013-03-14 MED ORDER — CITRIC ACID-SODIUM CITRATE 334-500 MG/5ML PO SOLN
30.0000 mL | ORAL | Status: DC | PRN
  Administered 2013-03-15: 30 mL via ORAL
  Filled 2013-03-14: qty 15

## 2013-03-14 MED ORDER — IBUPROFEN 600 MG PO TABS: 600.0000 mg | ORAL_TABLET | Freq: Four times a day (QID) | ORAL | Status: DC | PRN

## 2013-03-14 MED ORDER — ZOLPIDEM TARTRATE 5 MG PO TABS
5.0000 mg | ORAL_TABLET | Freq: Every evening | ORAL | Status: DC | PRN
  Administered 2013-03-14: 5 mg via ORAL
  Filled 2013-03-14: qty 1

## 2013-03-14 MED ORDER — OXYTOCIN 40 UNITS IN LACTATED RINGERS INFUSION - SIMPLE MED
1.0000 m[IU]/min | INTRAVENOUS | Status: DC
  Administered 2013-03-15: 2 m[IU]/min via INTRAVENOUS
  Filled 2013-03-14: qty 1000

## 2013-03-14 MED ORDER — OXYTOCIN BOLUS FROM INFUSION: 500.0000 mL | INTRAVENOUS | Status: DC

## 2013-03-14 MED ORDER — TERBUTALINE SULFATE 1 MG/ML IJ SOLN: 0.2500 mg | Freq: Once | INTRAMUSCULAR | Status: AC | PRN

## 2013-03-14 MED ORDER — ONDANSETRON HCL 4 MG/2ML IJ SOLN: 4.0000 mg | Freq: Four times a day (QID) | INTRAMUSCULAR | Status: DC | PRN

## 2013-03-14 MED ORDER — ACETAMINOPHEN 325 MG PO TABS: 650.0000 mg | ORAL_TABLET | ORAL | Status: DC | PRN

## 2013-03-14 MED ORDER — PENICILLIN G POTASSIUM 5000000 UNITS IJ SOLR
2.5000 10*6.[IU] | INTRAVENOUS | Status: DC
  Filled 2013-03-14 (×5): qty 2.5

## 2013-03-14 MED ORDER — DEXTROSE 5 % IV SOLN
5.0000 10*6.[IU] | Freq: Once | INTRAVENOUS | Status: AC
  Administered 2013-03-15: 5 10*6.[IU] via INTRAVENOUS
  Filled 2013-03-14: qty 5

## 2013-03-14 MED ORDER — MISOPROSTOL 25 MCG QUARTER TABLET
25.0000 ug | ORAL_TABLET | ORAL | Status: DC | PRN
  Administered 2013-03-14: 25 ug via VAGINAL
  Filled 2013-03-14: qty 0.25

## 2013-03-14 MED ORDER — LACTATED RINGERS IV SOLN
500.0000 mL | INTRAVENOUS | Status: DC | PRN
  Administered 2013-03-15 (×2): 1000 mL via INTRAVENOUS

## 2013-03-14 MED ORDER — OXYCODONE-ACETAMINOPHEN 5-325 MG PO TABS
1.0000 | ORAL_TABLET | ORAL | Status: DC | PRN
Start: 2013-03-14 — End: 2013-03-15

## 2013-03-14 MED ORDER — OXYTOCIN 40 UNITS IN LACTATED RINGERS INFUSION - SIMPLE MED: 62.5000 mL/h | INTRAVENOUS | Status: DC

## 2013-03-15 ENCOUNTER — Inpatient Hospital Stay (HOSPITAL_COMMUNITY): Payer: 59 | Admitting: Anesthesiology

## 2013-03-15 ENCOUNTER — Encounter (HOSPITAL_COMMUNITY): Payer: Self-pay

## 2013-03-15 ENCOUNTER — Encounter (HOSPITAL_COMMUNITY): Payer: 59 | Admitting: Anesthesiology

## 2013-03-15 ENCOUNTER — Encounter (HOSPITAL_COMMUNITY)
Admission: RE | Disposition: A | Discharge status: Home / Self Care | Payer: Self-pay | Source: Ambulatory Visit | Attending: Obstetrics and Gynecology

## 2013-03-15 LAB — CBC
HCT: 33.5 % — ABNORMAL LOW (ref 36.0–46.0)
HCT: 32.8 % — ABNORMAL LOW (ref 36.0–46.0)
Hemoglobin: 11.9 g/dL — ABNORMAL LOW (ref 12.0–15.0)
Hemoglobin: 11.8 g/dL — ABNORMAL LOW (ref 12.0–15.0)
MCH: 33.5 pg (ref 26.0–34.0)
MCH: 33.6 pg (ref 26.0–34.0)
MCHC: 35.5 g/dL (ref 30.0–36.0)
MCHC: 36 g/dL (ref 30.0–36.0)
MCV: 93.4 fL (ref 78.0–100.0)
Platelets: 108 10*3/uL — ABNORMAL LOW (ref 150–400)
RBC: 3.55 MIL/uL — ABNORMAL LOW (ref 3.87–5.11)
RBC: 3.51 MIL/uL — ABNORMAL LOW (ref 3.87–5.11)

## 2013-03-15 SURGERY — Surgical Case
Anesthesia: Epidural | Site: Abdomen | Wound class: Clean Contaminated

## 2013-03-15 MED ORDER — NALBUPHINE HCL 10 MG/ML IJ SOLN
5.0000 mg | INTRAMUSCULAR | Status: DC | PRN
  Filled 2013-03-15: qty 1

## 2013-03-15 MED ORDER — SIMETHICONE 80 MG PO CHEW
80.0000 mg | CHEWABLE_TABLET | ORAL | Status: DC
  Filled 2013-03-15 (×2): qty 1

## 2013-03-15 MED ORDER — PHENYLEPHRINE 40 MCG/ML (10ML) SYRINGE FOR IV PUSH (FOR BLOOD PRESSURE SUPPORT)
80.0000 ug | PREFILLED_SYRINGE | INTRAVENOUS | Status: DC | PRN
  Filled 2013-03-15: qty 2

## 2013-03-15 MED ORDER — LACTATED RINGERS IV SOLN: INTRAVENOUS | Status: DC

## 2013-03-15 MED ORDER — OXYTOCIN 10 UNIT/ML IJ SOLN
INTRAMUSCULAR | Status: AC
  Filled 2013-03-15: qty 4

## 2013-03-15 MED ORDER — CEFAZOLIN SODIUM-DEXTROSE 2-3 GM-% IV SOLR
2.0000 g | Freq: Three times a day (TID) | INTRAVENOUS | Status: DC
  Filled 2013-03-15: qty 50

## 2013-03-15 MED ORDER — SIMETHICONE 80 MG PO CHEW
80.0000 mg | CHEWABLE_TABLET | Freq: Three times a day (TID) | ORAL | Status: DC
  Administered 2013-03-15 – 2013-03-18 (×9): 80 mg via ORAL
  Filled 2013-03-15 (×5): qty 1

## 2013-03-15 MED ORDER — DIPHENHYDRAMINE HCL 50 MG/ML IJ SOLN: 12.5000 mg | INTRAMUSCULAR | Status: DC | PRN

## 2013-03-15 MED ORDER — PROMETHAZINE HCL 25 MG/ML IJ SOLN
12.5000 mg | Freq: Once | INTRAMUSCULAR | Status: AC
  Administered 2013-03-15: 12.5 mg via INTRAVENOUS
  Filled 2013-03-15: qty 1

## 2013-03-15 MED ORDER — OXYTOCIN 40 UNITS IN LACTATED RINGERS INFUSION - SIMPLE MED: 62.5000 mL/h | INTRAVENOUS | Status: AC

## 2013-03-15 MED ORDER — LANOLIN HYDROUS EX OINT: 1.0000 "application " | TOPICAL_OINTMENT | CUTANEOUS | Status: DC | PRN

## 2013-03-15 MED ORDER — SODIUM CHLORIDE 0.9 % IJ SOLN: 3.0000 mL | INTRAMUSCULAR | Status: DC | PRN

## 2013-03-15 MED ORDER — DIPHENHYDRAMINE HCL 25 MG PO CAPS
25.0000 mg | ORAL_CAPSULE | ORAL | Status: DC | PRN
  Filled 2013-03-15: qty 1

## 2013-03-15 MED ORDER — NALOXONE HCL 0.4 MG/ML IJ SOLN: 0.4000 mg | INTRAMUSCULAR | Status: DC | PRN

## 2013-03-15 MED ORDER — LIDOCAINE-EPINEPHRINE (PF) 2 %-1:200000 IJ SOLN
INTRAMUSCULAR | Status: AC
  Filled 2013-03-15: qty 20

## 2013-03-15 MED ORDER — MENTHOL 3 MG MT LOZG: 1.0000 | LOZENGE | OROMUCOSAL | Status: DC | PRN

## 2013-03-15 MED ORDER — CEFAZOLIN SODIUM-DEXTROSE 2-3 GM-% IV SOLR
INTRAVENOUS | Status: AC
  Filled 2013-03-15: qty 50

## 2013-03-15 MED ORDER — MORPHINE SULFATE (PF) 0.5 MG/ML IJ SOLN
INTRAMUSCULAR | Status: DC | PRN
  Administered 2013-03-15: 4 mg via EPIDURAL

## 2013-03-15 MED ORDER — IBUPROFEN 600 MG PO TABS
600.0000 mg | ORAL_TABLET | Freq: Four times a day (QID) | ORAL | Status: DC
  Administered 2013-03-15 – 2013-03-18 (×11): 600 mg via ORAL
  Filled 2013-03-15 (×11): qty 1

## 2013-03-15 MED ORDER — WITCH HAZEL-GLYCERIN EX PADS: 1.0000 "application " | MEDICATED_PAD | CUTANEOUS | Status: DC | PRN

## 2013-03-15 MED ORDER — EPHEDRINE 5 MG/ML INJ
10.0000 mg | INTRAVENOUS | Status: DC | PRN
  Filled 2013-03-15: qty 2
  Filled 2013-03-15: qty 4

## 2013-03-15 MED ORDER — PHENYLEPHRINE 40 MCG/ML (10ML) SYRINGE FOR IV PUSH (FOR BLOOD PRESSURE SUPPORT)
80.0000 ug | PREFILLED_SYRINGE | INTRAVENOUS | Status: DC | PRN
  Filled 2013-03-15: qty 2
  Filled 2013-03-15: qty 5

## 2013-03-15 MED ORDER — NALOXONE HCL 1 MG/ML IJ SOLN
1.0000 ug/kg/h | INTRAVENOUS | Status: DC | PRN
  Filled 2013-03-15: qty 2

## 2013-03-15 MED ORDER — ONDANSETRON HCL 4 MG PO TABS: 4.0000 mg | ORAL_TABLET | ORAL | Status: DC | PRN

## 2013-03-15 MED ORDER — FENTANYL CITRATE 0.05 MG/ML IJ SOLN: 25.0000 ug | INTRAMUSCULAR | Status: DC | PRN

## 2013-03-15 MED ORDER — EPHEDRINE 5 MG/ML INJ
INTRAVENOUS | Status: AC
  Filled 2013-03-15: qty 10

## 2013-03-15 MED ORDER — SCOPOLAMINE 1 MG/3DAYS TD PT72: 1.0000 | MEDICATED_PATCH | Freq: Once | TRANSDERMAL | Status: DC

## 2013-03-15 MED ORDER — TETANUS-DIPHTH-ACELL PERTUSSIS 5-2.5-18.5 LF-MCG/0.5 IM SUSP: 0.5000 mL | Freq: Once | INTRAMUSCULAR | Status: DC

## 2013-03-15 MED ORDER — ONDANSETRON HCL 4 MG/2ML IJ SOLN: 4.0000 mg | Freq: Three times a day (TID) | INTRAMUSCULAR | Status: DC | PRN

## 2013-03-15 MED ORDER — ZOLPIDEM TARTRATE 5 MG PO TABS: 5.0000 mg | ORAL_TABLET | Freq: Every evening | ORAL | Status: DC | PRN

## 2013-03-15 MED ORDER — PRENATAL MULTIVITAMIN CH
1.0000 | ORAL_TABLET | Freq: Every day | ORAL | Status: DC
  Administered 2013-03-16 – 2013-03-17 (×2): 1 via ORAL
  Filled 2013-03-15 (×3): qty 1

## 2013-03-15 MED ORDER — LACTATED RINGERS IV SOLN: 500.0000 mL | Freq: Once | INTRAVENOUS | Status: DC

## 2013-03-15 MED ORDER — PHENYLEPHRINE 40 MCG/ML (10ML) SYRINGE FOR IV PUSH (FOR BLOOD PRESSURE SUPPORT)
PREFILLED_SYRINGE | INTRAVENOUS | Status: AC
  Filled 2013-03-15: qty 15

## 2013-03-15 MED ORDER — DIPHENHYDRAMINE HCL 25 MG PO CAPS: 25.0000 mg | ORAL_CAPSULE | Freq: Four times a day (QID) | ORAL | Status: DC | PRN

## 2013-03-15 MED ORDER — OXYTOCIN 10 UNIT/ML IJ SOLN
40.0000 [IU] | INTRAVENOUS | Status: DC | PRN
  Administered 2013-03-15: 40 [IU] via INTRAVENOUS

## 2013-03-15 MED ORDER — LACTATED RINGERS IV SOLN
INTRAVENOUS | Status: DC | PRN
  Administered 2013-03-15: 11:00:00 via INTRAVENOUS

## 2013-03-15 MED ORDER — MORPHINE SULFATE 0.5 MG/ML IJ SOLN
INTRAMUSCULAR | Status: AC
  Filled 2013-03-15: qty 10

## 2013-03-15 MED ORDER — EPHEDRINE 5 MG/ML INJ
10.0000 mg | INTRAVENOUS | Status: DC | PRN
  Filled 2013-03-15: qty 2

## 2013-03-15 MED ORDER — ONDANSETRON HCL 4 MG/2ML IJ SOLN
INTRAMUSCULAR | Status: AC
  Filled 2013-03-15: qty 2

## 2013-03-15 MED ORDER — ONDANSETRON HCL 4 MG/2ML IJ SOLN: 4.0000 mg | INTRAMUSCULAR | Status: DC | PRN

## 2013-03-15 MED ORDER — METOCLOPRAMIDE HCL 5 MG/ML IJ SOLN: 10.0000 mg | Freq: Three times a day (TID) | INTRAMUSCULAR | Status: DC | PRN

## 2013-03-15 MED ORDER — LIDOCAINE HCL (PF) 1 % IJ SOLN
INTRAMUSCULAR | Status: DC | PRN
  Administered 2013-03-15 (×2): 4 mL

## 2013-03-15 MED ORDER — CEFAZOLIN SODIUM 1-5 GM-% IV SOLN
INTRAVENOUS | Status: DC | PRN
  Administered 2013-03-15: 2 g via INTRAVENOUS

## 2013-03-15 MED ORDER — SODIUM BICARBONATE 8.4 % IV SOLN
INTRAVENOUS | Status: AC
  Filled 2013-03-15: qty 50

## 2013-03-15 MED ORDER — FENTANYL 2.5 MCG/ML BUPIVACAINE 1/10 % EPIDURAL INFUSION (WH - ANES)
INTRAMUSCULAR | Status: DC | PRN
  Administered 2013-03-15: 14.5 mL/h via EPIDURAL

## 2013-03-15 MED ORDER — OXYCODONE-ACETAMINOPHEN 5-325 MG PO TABS
1.0000 | ORAL_TABLET | ORAL | Status: DC | PRN
  Administered 2013-03-17: 1 via ORAL
  Filled 2013-03-15 (×2): qty 1

## 2013-03-15 MED ORDER — DIBUCAINE 1 % RE OINT: 1.0000 "application " | TOPICAL_OINTMENT | RECTAL | Status: DC | PRN

## 2013-03-15 MED ORDER — MEPERIDINE HCL 25 MG/ML IJ SOLN: 6.2500 mg | INTRAMUSCULAR | Status: DC | PRN

## 2013-03-15 MED ORDER — LIDOCAINE-EPINEPHRINE (PF) 2 %-1:200000 IJ SOLN
INTRAMUSCULAR | Status: DC | PRN
  Administered 2013-03-15: 5 mL via EPIDURAL
  Administered 2013-03-15: 10 mL via EPIDURAL

## 2013-03-15 MED ORDER — ONDANSETRON HCL 4 MG/2ML IJ SOLN
INTRAMUSCULAR | Status: DC | PRN
  Administered 2013-03-15: 4 mg via INTRAMUSCULAR

## 2013-03-15 MED ORDER — FENTANYL 2.5 MCG/ML BUPIVACAINE 1/10 % EPIDURAL INFUSION (WH - ANES): 14.0000 mL/h | INTRAMUSCULAR | Status: DC | PRN

## 2013-03-15 MED ORDER — CLONAZEPAM 0.5 MG PO TABS
0.5000 mg | ORAL_TABLET | Freq: Two times a day (BID) | ORAL | Status: DC
  Administered 2013-03-18: 0.5 mg via ORAL
  Filled 2013-03-15 (×3): qty 1

## 2013-03-15 MED ORDER — DIPHENHYDRAMINE HCL 50 MG/ML IJ SOLN: 25.0000 mg | INTRAMUSCULAR | Status: DC | PRN

## 2013-03-15 MED ORDER — BUTORPHANOL TARTRATE 1 MG/ML IJ SOLN
2.0000 mg | Freq: Once | INTRAMUSCULAR | Status: AC
  Administered 2013-03-15: 2 mg via INTRAVENOUS
  Filled 2013-03-15: qty 2

## 2013-03-15 MED ORDER — SIMETHICONE 80 MG PO CHEW: 80.0000 mg | CHEWABLE_TABLET | ORAL | Status: DC | PRN

## 2013-03-15 MED ORDER — SENNOSIDES-DOCUSATE SODIUM 8.6-50 MG PO TABS
2.0000 | ORAL_TABLET | ORAL | Status: DC
  Administered 2013-03-16 (×2): 2 via ORAL
  Filled 2013-03-15 (×2): qty 2

## 2013-03-15 MED ORDER — LACTATED RINGERS IV SOLN
INTRAVENOUS | Status: DC | PRN
  Administered 2013-03-15 (×2): via INTRAVENOUS

## 2013-03-15 SURGICAL SUPPLY — 37 items
ADH SKN CLS APL DERMABOND .7 (GAUZE/BANDAGES/DRESSINGS) ×1
CLAMP CORD UMBIL (MISCELLANEOUS) IMPLANT
CLOTH BEACON ORANGE TIMEOUT ST (SAFETY) ×2 IMPLANT
DERMABOND ADVANCED (GAUZE/BANDAGES/DRESSINGS) ×1
DERMABOND ADVANCED .7 DNX12 (GAUZE/BANDAGES/DRESSINGS) IMPLANT
DRAPE LG THREE QUARTER DISP (DRAPES) ×4 IMPLANT
DRAPE WARM FLUID 44X44 (DRAPE) IMPLANT
DRSG OPSITE POSTOP 4X10 (GAUZE/BANDAGES/DRESSINGS) ×2 IMPLANT
DURAPREP 26ML APPLICATOR (WOUND CARE) ×2 IMPLANT
ELECT REM PT RETURN 9FT ADLT (ELECTROSURGICAL) ×2
ELECTRODE REM PT RTRN 9FT ADLT (ELECTROSURGICAL) ×1 IMPLANT
EXTRACTOR VACUUM KIWI (MISCELLANEOUS) IMPLANT
EXTRACTOR VACUUM M CUP 4 TUBE (SUCTIONS) IMPLANT
GLOVE BIO SURGEON STRL SZ 6 (GLOVE) ×2 IMPLANT
GLOVE BIOGEL PI IND STRL 6 (GLOVE) ×2 IMPLANT
GLOVE BIOGEL PI IND STRL 7.0 (GLOVE) IMPLANT
GLOVE BIOGEL PI INDICATOR 6 (GLOVE) ×2
GLOVE BIOGEL PI INDICATOR 7.0 (GLOVE) ×2
GLOVE ECLIPSE 7.0 STRL STRAW (GLOVE) ×1 IMPLANT
GLOVE SURG SS PI 6.5 STRL IVOR (GLOVE) ×1 IMPLANT
GOWN STRL REIN XL XLG (GOWN DISPOSABLE) ×5 IMPLANT
KIT ABG SYR 3ML LUER SLIP (SYRINGE) ×2 IMPLANT
NDL HYPO 25X5/8 SAFETYGLIDE (NEEDLE) ×1 IMPLANT
NEEDLE HYPO 25X5/8 SAFETYGLIDE (NEEDLE) ×2 IMPLANT
NS IRRIG 1000ML POUR BTL (IV SOLUTION) ×2 IMPLANT
PACK C SECTION WH (CUSTOM PROCEDURE TRAY) ×2 IMPLANT
PAD OB MATERNITY 4.3X12.25 (PERSONAL CARE ITEMS) ×2 IMPLANT
STAPLER VISISTAT 35W (STAPLE) IMPLANT
SUT CHROMIC 0 CTX 36 (SUTURE) ×6 IMPLANT
SUT MON AB 2-0 CT1 27 (SUTURE) ×2 IMPLANT
SUT PDS AB 0 CT1 27 (SUTURE) IMPLANT
SUT PLAIN 0 NONE (SUTURE) IMPLANT
SUT VIC AB 0 CT1 36 (SUTURE) ×3 IMPLANT
SUT VIC AB 4-0 KS 27 (SUTURE) IMPLANT
TOWEL OR 17X24 6PK STRL BLUE (TOWEL DISPOSABLE) ×2 IMPLANT
TRAY FOLEY CATH 14FR (SET/KITS/TRAYS/PACK) IMPLANT
WATER STERILE IRR 1000ML POUR (IV SOLUTION) ×2 IMPLANT

## 2013-03-15 NOTE — H&P (Signed)
Alexandria Hanna is a 37 y.o. female presenting for post-dates induction.  She was admitted last night and given a single dose of cytotec.  She has had a dose of IV pain meds with improvement in pain but she is currently uncomfortable again and requests epidural.  Antepartum course complicated by AMA with negative MaterniT21, Rh negative, GBS positive.  She has a h/o positive HSV serology but no definitive outbreak this pregnancy.  She has not been on suppression.  She denies current prodrome or lesions.    Maternal Medical History:  Prenatal complications: no prenatal complications Prenatal Complications - Diabetes: none.    OB History   Grav Para Term Preterm Abortions TAB SAB Ect Mult Living   3 0 0 0 2 1 1 0 0 0      Past Medical History  Diagnosis Date  . ADHD (attention deficit hyperactivity disorder)   . Hyperhydrosis disorder   . Allergy     RHINITIS  . AMA (advanced maternal age) multigravida 35+   . Herpes   . Asthma     related to lactose intolerance but rarely needs inhaler  . Carpal tunnel syndrome, bilateral    Past Surgical History  Procedure Laterality Date  . Dilation and evacuation  10/26/2011    Procedure: DILATATION AND EVACUATION;  Surgeon: Juluis Mire, MD;  Location: [facility];  Service: Gynecology;  Laterality: N/A;  . Rhinoplasty     Family History: family history includes Hypertension in her father and paternal grandmother; Thyroid disease in her mother. Social History:  reports that she has never smoked. She has never used smokeless tobacco. She reports that she does not drink alcohol or use illicit drugs.   Prenatal Transfer Tool  Maternal Diabetes: No Genetic Screening: Normal Maternal Ultrasounds/Referrals: Normal Fetal Ultrasounds or other Referrals:  None Maternal Substance Abuse:  No Significant Maternal Medications:  None Significant Maternal Lab Results:  Lab values include: Group B Strep positive, Rh negative Other Comments:   None  ROS  Dilation: 1 Effacement (%): 70 Station: -2 Exam by:: Dr Langston Masker Blood pressure 145/90, pulse 70, temperature 97.5 F (36.4 C), temperature source Axillary, resp. rate 18, height 5\' 8"  (1.727 m), weight 177 lb (80.287 kg). Maternal Exam:  Uterine Assessment: Contraction strength is moderate.  Contraction frequency is irregular.   Abdomen: Patient reports no abdominal tenderness. Fundal height is c/w dates .   Estimated fetal weight is 8#.   Fetal presentation: vertex  Introitus: Normal vulva. Normal vagina.  Pelvis: adequate for delivery.   Cervix: Cervix evaluated by sterile speculum exam and digital exam.     Physical Exam  Constitutional: She is oriented to person, place, and time. She appears well-developed and well-nourished.  GI: Soft. There is no rebound and no guarding.  Genitourinary: Vagina normal and uterus normal.  Neurological: She is alert and oriented to person, place, and time.  Skin: Skin is warm and dry.  Psychiatric: She has a normal mood and affect. Her behavior is normal.    Prenatal labs: ABO, Rh: B/Negative/-- (03/20 0000) Antibody: Negative (03/20 0000) Rubella: Immune (03/20 0000) RPR: NON REACTIVE (10/07 2020)  HBsAg: Negative (03/20 0000)  HIV: Non-reactive (03/20 0000)  GBS: Positive (09/02 0000)   Assessment/Plan: 37yo G3P0020 at [redacted]w[redacted]d for postdates induction -GBS pos-PCN -Epidural -Pitocin induction -Anticipate NSVD   Alexandria Hanna 03/15/2013, 8:55 AM

## 2013-03-15 NOTE — Progress Notes (Signed)
Continued repetitive decelerations despite repositioning, pitocin off, amnioinfusion.  No scalp stimulation with last check; cvx Apr 07, 1974/-1.  Patient counseled for C/S including risk of bleeding, infection, scarring, damage to surrounding structures and implications in future pregnancies.  She understands the risk of transfusion, life-saving C-hyst and uterine rupture in next pregnancy.  All questions answered.  Will proceed to C/S.

## 2013-03-15 NOTE — Progress Notes (Signed)
Lab notified of need for new CBC prior to epidural placement.

## 2013-03-15 NOTE — Addendum Note (Signed)
Addendum created 03/15/13 1827 by Shanon Payor, CRNA   Modules edited: Anesthesia Events

## 2013-03-15 NOTE — Progress Notes (Signed)
POC for C/S initiated. 

## 2013-03-15 NOTE — Progress Notes (Signed)
Dr Vincente Poli updated on FHR, UC pattern, pt pain level, and pt request for pain medication.  Orders given to give 2mg  stadol once IV, and 12.5 mg phenergan IV once.  Pt may also have epidural upon request.

## 2013-03-15 NOTE — Op Note (Signed)
Alexandria Hanna PROCEDURE DATE: 03/14/2013 - 03/15/2013  PREOPERATIVE DIAGNOSIS: Intrauterine pregnancy at  [redacted]w[redacted]d weeks gestation with non-reassuring tracing  POSTOPERATIVE DIAGNOSIS: The same  PROCEDURE:  Primary  Low Transverse Cesarean Section  SURGEON:  Dr. Mitchel Honour  INDICATIONS: Alexandria Hanna is a 37 y.o. G3P0020 at [redacted]w[redacted]d scheduled for cesarean section secondary to non-reassuring tracing.  The risks of cesarean section discussed with the patient included but were not limited to: bleeding which may require transfusion or reoperation; infection which may require antibiotics; injury to bowel, bladder, ureters or other surrounding organs; injury to the fetus; need for additional procedures including hysterectomy in the event of a life-threatening hemorrhage; placental abnormalities wth subsequent pregnancies, incisional problems, thromboembolic phenomenon and other postoperative/anesthesia complications. The patient concurred with the proposed plan, giving informed written consent for the procedure.    FINDINGS:  Viable female infant in cephalic presentation, APGARs 9,9:  Weight pending  Clear amniotic fluid.  Intact placenta, three vessel cord.  Grossly normal uterus, ovaries and fallopian tubes. .   ANESTHESIA:    Epidural ESTIMATED BLOOD LOSS: 750 ml SPECIMENS: Placenta sent to L&D COMPLICATIONS: None immediate  PROCEDURE IN DETAIL:  The patient received intravenous antibiotics and had sequential compression devices applied to her lower extremities while in the preoperative area.  She was then taken to the operating room where epidural anesthesia was dosed up to surgical level and was found to be adequate. She was then placed in a dorsal supine position with a leftward tilt, and prepped and draped in a sterile manner.  A foley catheter was placed into her bladder and attached to constant gravity.  After an adequate timeout was performed, a Pfannenstiel skin incision was made with scalpel  and carried through to the underlying layer of fascia. The fascia was incised in the midline and this incision was extended bilaterally using the Mayo scissors. Kocher clamps were applied to the superior aspect of the fascial incision and the underlying rectus muscles were dissected off bluntly. A similar process was carried out on the inferior aspect of the facial incision. The rectus muscles were separated in the midline bluntly and the peritoneum was entered bluntly.  A bladder flap was created sharply and developed bluntly.  Bladder was protected behind the bladder blade. A transverse hysterotomy was made with a scalpel and extended bilaterally bluntly. The bladder blade was then removed. The infant was successfully delivered, and cord was clamped and cut and infant was handed over to awaiting neonatology team. Uterine massage was then administered and the placenta delivered intact with three-vessel cord. The uterus was cleared of clot and debris.  The hysterotomy was closed with 0 vicryl.  A second imbricating suture of 0-Vicryl was used to reinforce the incision and aid in hemostasis.  The peritoneum and rectus muscles were noted to be hemostatic and were reapproximated using 2-0 Monocryl.  The fascia was closed with 0-Vicryl in a running fashion with good restoration of anatomy.  The subcutaneus tissue was copiously irrigated.  The skin was closed with 2-0 Vicryl in a subcuticular fashion.  Pt tolerated the procedure will.  All counts were correct x2.  Pt went to the recovery room in stable condition.

## 2013-03-15 NOTE — Anesthesia Postprocedure Evaluation (Signed)
  Anesthesia Post-op Note  Patient: Alexandria Hanna  Procedure(s) Performed: Procedure(s): CESAREAN SECTION (N/A)  Patient Location: PACU  Anesthesia Type:Epidural  Level of Consciousness: awake, alert  and oriented  Airway and Oxygen Therapy: Patient Spontanous Breathing  Post-op Pain: none  Post-op Assessment: Post-op Vital signs reviewed, Patient's Cardiovascular Status Stable, Respiratory Function Stable, Patent Airway, No signs of Nausea or vomiting, Pain level controlled, No headache and No backache  Post-op Vital Signs: Reviewed and stable  Complications: No apparent anesthesia complications

## 2013-03-15 NOTE — Anesthesia Procedure Notes (Signed)
Epidural Patient location during procedure: OB Start time: 03/15/2013 9:03 AM  Staffing Anesthesiologist: Sanaii Caporaso A. Performed by: anesthesiologist   Preanesthetic Checklist Completed: patient identified, site marked, surgical consent, pre-op evaluation, timeout performed, IV checked, risks and benefits discussed and monitors and equipment checked  Epidural Patient position: sitting Prep: site prepped and draped and DuraPrep Patient monitoring: continuous pulse ox and blood pressure Approach: midline Injection technique: LOR air  Needle:  Needle type: Tuohy  Needle gauge: 17 G Needle length: 9 cm and 9 Needle insertion depth: 5 cm cm Catheter type: closed end flexible Catheter size: 19 Gauge Catheter at skin depth: 10 cm Test dose: negative and Other  Assessment Events: blood not aspirated, injection not painful, no injection resistance, negative IV test and no paresthesia  Additional Notes Patient identified. Risks and benefits discussed including failed block, incomplete  Pain control, post dural puncture headache, nerve damage, paralysis, blood pressure Changes, nausea, vomiting, reactions to medications-both toxic and allergic and post Partum back pain. All questions were answered. Patient expressed understanding and wished to proceed. Sterile technique was used throughout procedure. Epidural site was Dressed with sterile barrier dressing. No paresthesias, signs of intravascular injection Or signs of intrathecal spread were encountered.  Patient was more comfortable after the epidural was dosed. Please see RN's note for documentation of vital signs and FHR which are stable.

## 2013-03-15 NOTE — Transfer of Care (Signed)
Immediate Anesthesia Transfer of Care Note  Patient: Alexandria Hanna  Procedure(s) Performed: Procedure(s): CESAREAN SECTION (N/A)  Patient Location: PACU  Anesthesia Type:Epidural  Level of Consciousness: awake, alert  and oriented  Airway & Oxygen Therapy: Patient Spontanous Breathing  Post-op Assessment: Report given to PACU RN and Post -op Vital signs reviewed and stable  Post vital signs: Reviewed and stable  Complications: No apparent anesthesia complications

## 2013-03-15 NOTE — Anesthesia Postprocedure Evaluation (Signed)
  Anesthesia Post-op Note  Patient: Alexandria Hanna  Procedure(s) Performed: Procedure(s): CESAREAN SECTION (N/A)  Patient Location: Mother/Baby  Anesthesia Type:Epidural  Level of Consciousness: awake, alert  and oriented  Airway and Oxygen Therapy: Patient Spontanous Breathing  Post-op Pain: none  Post-op Assessment: Post-op Vital signs reviewed, Patient's Cardiovascular Status Stable, No headache, No backache, No residual numbness and No residual motor weakness  Post-op Vital Signs: Reviewed and stable  Complications: No apparent anesthesia complications

## 2013-03-15 NOTE — Progress Notes (Signed)
Called to see patient secondary to recurrent decelerations after epidural placement.  SVE: 3/75/-1.  AROM with FSE and IUPC placed.  +Scalp stimulation noted.  Will leave pitocin off until strip reassuring x 20-30 minutes.

## 2013-03-15 NOTE — Progress Notes (Signed)
1108  85 ml fentanyl wasted in sink with Haydee Monica, RN and Montez Morita, RN.

## 2013-03-15 NOTE — Anesthesia Preprocedure Evaluation (Signed)
Anesthesia Evaluation  Patient identified by MRN, date of birth, ID band Patient awake    Reviewed: Allergy & Precautions, H&P , Patient's Chart, lab work & pertinent test results  Airway Mallampati: III TM Distance: >3 FB Neck ROM: Full    Dental no notable dental hx. (+) Teeth Intact   Pulmonary asthma ,  breath sounds clear to auscultation  Pulmonary exam normal       Cardiovascular negative cardio ROS  Rhythm:Regular Rate:Normal     Neuro/Psych PSYCHIATRIC DISORDERS ADHD Neuromuscular disease    GI/Hepatic Neg liver ROS,   Endo/Other  negative endocrine ROS  Renal/GU negative Renal ROS  negative genitourinary   Musculoskeletal negative musculoskeletal ROS (+)   Abdominal (+) - obese,   Peds  Hematology Thrombocytopenia   Anesthesia Other Findings   Reproductive/Obstetrics HSV                           Anesthesia Physical Anesthesia Plan  ASA: II  Anesthesia Plan: Epidural   Post-op Pain Management:    Induction:   Airway Management Planned: Natural Airway  Additional Equipment:   Intra-op Plan:   Post-operative Plan:   Informed Consent: I have reviewed the patients History and Physical, chart, labs and discussed the procedure including the risks, benefits and alternatives for the proposed anesthesia with the patient or authorized representative who has indicated his/her understanding and acceptance.     Plan Discussed with: Anesthesiologist  Anesthesia Plan Comments:         Anesthesia Quick Evaluation

## 2013-03-16 ENCOUNTER — Encounter (HOSPITAL_COMMUNITY): Payer: Self-pay | Admitting: Obstetrics & Gynecology

## 2013-03-16 LAB — CBC
Hemoglobin: 11 g/dL — ABNORMAL LOW (ref 12.0–15.0)
MCH: 33.7 pg (ref 26.0–34.0)
MCV: 93.6 fL (ref 78.0–100.0)
Platelets: 108 10*3/uL — ABNORMAL LOW (ref 150–400)
RBC: 3.26 MIL/uL — ABNORMAL LOW (ref 3.87–5.11)
WBC: 9 10*3/uL (ref 4.0–10.5)

## 2013-03-16 MED ORDER — RHO D IMMUNE GLOBULIN 1500 UNIT/2ML IJ SOLN
300.0000 ug | Freq: Once | INTRAMUSCULAR | Status: AC
  Administered 2013-03-16: 300 ug via INTRAMUSCULAR
  Filled 2013-03-16: qty 2

## 2013-03-16 NOTE — Progress Notes (Signed)
Subjective: Postpartum Day 1: Cesarean Delivery Patient reports tolerating PO and no problems voiding.    Objective: Vital signs in last 24 hours: Temp:  [97.5 F (36.4 C)-98.6 F (37 C)] 98.6 F (37 C) (10/09 0556) Pulse Rate:  [63-90] 90 (10/09 0556) Resp:  [12-23] 18 (10/09 0556) BP: (100-123)/(40-96) 100/63 mmHg (10/09 0556) SpO2:  [96 %-100 %] 98 % (10/09 0556)  Physical Exam:  General: alert and cooperative Lochia: appropriate Uterine Fundus: firm Incision: small old blood noted on bandage DVT Evaluation: No evidence of DVT seen on physical exam. Negative Homan's sign. No cords or calf tenderness. Calf/Ankle edema is present.   Recent Labs  03/15/13 1315 03/16/13 0605  HGB 11.8* 11.0*  HCT 32.8* 30.5*    Assessment/Plan: Status post Cesarean section. Doing well postoperatively.  Continue current care Desires circ.  CURTIS,CAROL G 03/16/2013, 8:00 AM

## 2013-03-17 LAB — RH IG WORKUP (INCLUDES ABO/RH)
ABO/RH(D): B NEG
Fetal Screen: NEGATIVE
Gestational Age(Wks): 41.1
Unit division: 0

## 2013-03-17 NOTE — Progress Notes (Signed)
Subjective: Postpartum Day 2: Cesarean Delivery Patient reports tolerating PO, + flatus and no problems voiding.    Objective: Vital signs in last 24 hours: Temp:  [97.8 F (36.6 C)-98 F (36.7 C)] 98 F (36.7 C) (10/10 0525) Pulse Rate:  [67-82] 67 (10/10 0525) Resp:  [16-18] 18 (10/10 0525) BP: (106-113)/(58-76) 110/58 mmHg (10/10 0525) SpO2:  [96 %-98 %] 98 % (10/10 0525)  Physical Exam:  General: alert and cooperative Lochia: appropriate Uterine Fundus: firm Incision: bandage with old drainage noted DVT Evaluation: No evidence of DVT seen on physical exam. Negative Homan's sign. No cords or calf tenderness. No significant calf/ankle edema.   Recent Labs  03/15/13 1315 03/16/13 0605  HGB 11.8* 11.0*  HCT 32.8* 30.5*    Assessment/Plan: Status post Cesarean section. Doing well postoperatively.  Continue current care.  Shellene Sweigert G 03/17/2013, 8:10 AM

## 2013-03-18 MED ORDER — OXYCODONE-ACETAMINOPHEN 5-325 MG PO TABS: 1.0000 | ORAL_TABLET | ORAL | Status: DC | PRN

## 2013-03-18 NOTE — Progress Notes (Signed)
Patient discharge instructions and papers completed, denies questions or needs at this time.

## 2013-03-18 NOTE — Discharge Summary (Signed)
Obstetric Discharge Summary Reason for Admission: onset of labor Prenatal Procedures: ultrasound Intrapartum Procedures: cesarean: low cervical, transverse Postpartum Procedures: none Complications-Operative and Postpartum: none Hemoglobin  Date Value Range Status  03/16/2013 11.0* 12.0 - 15.0 g/dL Final     HCT  Date Value Range Status  03/16/2013 30.5* 36.0 - 46.0 % Final    Physical Exam:  General: alert and cooperative Lochia: appropriate Uterine Fundus: firm Incision: no significant drainage DVT Evaluation: No evidence of DVT seen on physical exam.  Discharge Diagnoses: Term Pregnancy-delivered  Discharge Information: Date: 03/18/2013 Activity: pelvic rest Diet: routine Medications: PNV, Ibuprofen and Percocet Condition: stable Instructions: refer to practice specific booklet Discharge to: home Follow-up Information   Schedule an appointment as soon as possible for a visit in 2 weeks to follow up.      Newborn Data: Live born female  Birth Weight: 7 lb 9.3 oz (3439 g) APGAR: 9, 9  Home with mother.  Paw Karstens 03/18/2013, 10:04 AM

## 2013-05-15 ENCOUNTER — Ambulatory Visit (INDEPENDENT_AMBULATORY_CARE_PROVIDER_SITE_OTHER): Payer: 59 | Admitting: Family Medicine

## 2013-05-15 VITALS — BP 118/70 | HR 72

## 2013-05-15 DIAGNOSIS — F909 Attention-deficit hyperactivity disorder, unspecified type: Secondary | ICD-10-CM

## 2013-05-15 MED ORDER — METHYLPHENIDATE HCL ER (OSM) 36 MG PO TBCR: 36.0000 mg | EXTENDED_RELEASE_TABLET | Freq: Every day | ORAL | Status: DC

## 2013-05-15 MED ORDER — RITALIN 20 MG PO TABS: 20.0000 mg | ORAL_TABLET | Freq: Two times a day (BID) | ORAL | Status: DC

## 2013-05-15 NOTE — Progress Notes (Signed)
   Subjective:    Patient ID: Alexandria Hanna, female    DOB: 05-29-1976, 37 y.o.   MRN: [id number]  HPI She is here for medication recheck. When she got pregnant she stopped her medications and delivered in October. She has been breast-feeding but recently stopped. She is interested in getting back on her medications. No difficulty with feeling overwhelmed and focus, increased irritability, mood swings, difficulty with interaction with her husband she would like to go back on Concerta and Ritalin combination. She was originally diagnosed at age 12 when she saw a psychologist who did testing on her when she lived in [place]   Review of Systems     Objective:   Physical Exam Alert and in no distress otherwise not examined       Assessment & Plan:  ADHD (attention deficit hyperactivity disorder) - Plan: RITALIN 20 MG tablet, RITALIN 20 MG tablet, RITALIN 20 MG tablet, methylphenidate (CONCERTA) 36 MG CR tablet, methylphenidate (CONCERTA) 36 MG CR tablet, methylphenidate (CONCERTA) 36 MG CR tablet  She has done well on this combination in the past and would like to continue. Of note is the fact that the Ritalin needs to be branded products to work probably for her.

## 2013-06-23 ENCOUNTER — Ambulatory Visit (INDEPENDENT_AMBULATORY_CARE_PROVIDER_SITE_OTHER): Payer: 59 | Admitting: Family Medicine

## 2013-06-23 ENCOUNTER — Encounter: Payer: Self-pay | Admitting: Family Medicine

## 2013-06-23 VITALS — BP 112/70 | HR 62 | Wt 152.0 lb

## 2013-06-23 DIAGNOSIS — M654 Radial styloid tenosynovitis [de Quervain]: Secondary | ICD-10-CM

## 2013-06-23 NOTE — Patient Instructions (Addendum)
2 Aleve twice per day. Heat for 20 minutes 3 times per day. Splinting is as needed. If that doesn't work then our refer to physical therapy  lift thumbsup!

## 2013-06-23 NOTE — Progress Notes (Signed)
   Subjective:    Patient ID: Alexandria Hanna, female    DOB: Sep 21, 1975, 37 y.o.   MRN: [id number]  HPI She complains of bilateral wrist pain that started during her pregnancy. She is now getting better especially on the left side. She continues to have difficulty with right wrist pain especially with lifting activities. She does occasionally feel a popping sensation in the right wrist.  Review of Systems     Objective:   Physical Exam Full motion of the thumb with no tenderness to palpation or motion of all the joints. Tenderness proximal to the MCP joint with positive Finkelstein test.       Assessment & Plan:  De Quervain's tenosynovitis, right  recommend heat, 2 Aleve twice per day and relative rest including using a splint. If this is not beneficial, referral to physical therapy will be made.

## 2013-07-04 ENCOUNTER — Ambulatory Visit: Payer: 59 | Admitting: Family Medicine

## 2013-07-06 ENCOUNTER — Encounter: Payer: Self-pay | Admitting: Family Medicine

## 2013-07-06 ENCOUNTER — Ambulatory Visit (INDEPENDENT_AMBULATORY_CARE_PROVIDER_SITE_OTHER): Payer: 59 | Admitting: Family Medicine

## 2013-07-06 ENCOUNTER — Telehealth: Payer: Self-pay | Admitting: Family Medicine

## 2013-07-06 VITALS — BP 108/74 | HR 88 | Wt 152.0 lb

## 2013-07-06 DIAGNOSIS — J111 Influenza due to unidentified influenza virus with other respiratory manifestations: Secondary | ICD-10-CM

## 2013-07-06 MED ORDER — HYDROCOD POLST-CHLORPHEN POLST 10-8 MG/5ML PO LQCR: 5.0000 mL | Freq: Two times a day (BID) | ORAL | Status: DC | PRN

## 2013-07-06 MED ORDER — BENZONATATE 100 MG PO CAPS: 100.0000 mg | ORAL_CAPSULE | Freq: Three times a day (TID) | ORAL | Status: DC | PRN

## 2013-07-06 NOTE — Patient Instructions (Addendum)
Take one or 2 of the Tessalon Perles 3 times per day as needed for coughing. 2 Called the first of the week if not getting better.

## 2013-07-06 NOTE — Telephone Encounter (Signed)
Tessalon Perles did not help with the cough. I will give Alexandria Hanna Tussionex. At this point she is apparently not breast-feeding

## 2013-07-06 NOTE — Progress Notes (Signed)
   Subjective:    Patient ID: Alexandria Hanna, female    DOB: 02/20/1976, 38 y.o.   MRN: [id number]  HPI She has a 4 day history of fever, chills, dry cough, headache and nasal can slight sore throat. She does not smoke.   Review of Systems     Objective:   Physical Exam alert and in no distress. Tympanic membranes and canals are normal. Throat is red, Tonsils are normal. Neck is supple without adenopathy or thyromegaly. Cardiac exam shows a regular sinus rhythm without murmurs or gallops. Lungs are clear to auscultation.        Assessment & Plan:  Influenza - Plan: benzonatate (TESSALON) 100 MG capsule  teen using Tylenol. If the Tessalon doesn't work she will call me tomorrow. Also recommended: The first week if she is getting worse. Discussed the possibility of secondary bacterial infection with her.

## 2013-07-07 ENCOUNTER — Telehealth: Payer: Self-pay | Admitting: Internal Medicine

## 2013-07-07 MED ORDER — ALBUTEROL SULFATE HFA 108 (90 BASE) MCG/ACT IN AERS: 2.0000 | INHALATION_SPRAY | Freq: Four times a day (QID) | RESPIRATORY_TRACT | Status: DC | PRN

## 2013-07-07 NOTE — Telephone Encounter (Signed)
Pt called stating that the cough syrup is not helping, she was able to sleep through the night but this morning she is coughing so much she feels like she is going to throw up and the coughing has made her asthma flare up. She does not want to go through the weekend and get bronchitis or something else and have to get a breathing treatment in the ER. She wants to know what does she need to do. Her pharmacy is [facility] [address].

## 2013-08-15 ENCOUNTER — Telehealth: Payer: Self-pay | Admitting: Family Medicine

## 2013-08-15 NOTE — Telephone Encounter (Signed)
LM

## 2013-08-16 ENCOUNTER — Other Ambulatory Visit: Payer: Self-pay | Admitting: Obstetrics and Gynecology

## 2013-08-16 ENCOUNTER — Other Ambulatory Visit: Payer: Self-pay

## 2013-08-16 DIAGNOSIS — Z1231 Encounter for screening mammogram for malignant neoplasm of breast: Secondary | ICD-10-CM

## 2013-08-16 DIAGNOSIS — F901 Attention-deficit hyperactivity disorder, predominantly hyperactive type: Secondary | ICD-10-CM

## 2013-08-30 ENCOUNTER — Ambulatory Visit
Admission: RE | Admit: 2013-08-30 | Discharge: 2013-08-30 | Disposition: A | Discharge status: Home / Self Care | Payer: Self-pay | Source: Ambulatory Visit

## 2013-08-30 DIAGNOSIS — Z1231 Encounter for screening mammogram for malignant neoplasm of breast: Secondary | ICD-10-CM

## 2013-09-28 ENCOUNTER — Ambulatory Visit (INDEPENDENT_AMBULATORY_CARE_PROVIDER_SITE_OTHER): Payer: 59 | Admitting: Family Medicine

## 2013-09-28 ENCOUNTER — Encounter: Payer: Self-pay | Admitting: Family Medicine

## 2013-09-28 VITALS — BP 112/70 | HR 68 | Temp 98.4°F | Wt 149.0 lb

## 2013-09-28 DIAGNOSIS — J019 Acute sinusitis, unspecified: Secondary | ICD-10-CM

## 2013-09-28 MED ORDER — AMOXICILLIN 875 MG PO TABS: 875.0000 mg | ORAL_TABLET | Freq: Two times a day (BID) | ORAL | Status: DC

## 2013-09-28 NOTE — Progress Notes (Signed)
   Subjective:    Patient ID: Alexandria Hanna, female    DOB: [birth year], 38 y.o.   MRN: [id number]  HPI  Alexandria Hanna is a very pleasant 38 y.o. yo female who  has a past medical history of ADHD (attention deficit hyperactivity disorder); Hyperhydrosis disorder; Allergy; AMA (advanced maternal age) multigravida 91+; Herpes; Asthma; and Carpal tunnel syndrome, bilateral. She presents today for an acute visit today regarding sinus pain.   8 days ago the patient started having a runny nose which within a day or two progressed to sinus pressure, red/itchy eyes, copious yellow/green phglem and drainage, wheezing, dry non-productive cough and a very sore throat. The patient also developed muscle aches in the neck and back a few days ago and has had a dull constant headache from the pressure in her sinues. The patient has a 4 old who has been sick for two weeks with fever, conjunctivitis, chest congestion and ear infection. The patient used some of her son's eye drops for her red eyes and had resolution of her red/itchy eyes. The patient denies any objective fever, rash or night sweats. The patient has 4-5 days of OTC allergies medications without relief. She does not smoke  Review of Systems is negative except per HPI.    Objective:   Physical Exam  Physical Exam  Constitutional: Patient is oriented to person, place, and time and well-developed, well-nourished, and in no distress. Eyes: Conjunctivae and EOM are normal. Pupils are equal, round, and reactive to light.  Ears: Right TM appears slightly more pink and shiny than the left Nose: nasal mucosa slightly reddish/purple Cardiovascular: Normal rate, regular rhythm. Pulmonary/Chest: Effort normal and breath sounds normal without wheezing or ronchi.  Sinus: Pain illicited with palpation of the frontal and maxillary sinuses    Assessment & Plan:   Acute sinusitis - Plan: amoxicillin (AMOXIL) 875 MG tablet  Will treat BID for 10  days. Instructed the patient's call if not entirely better when she finishes the antibiotic.

## 2013-09-28 NOTE — Progress Notes (Deleted)
   Subjective:    Patient ID: Alexandria Hanna, female    DOB: June 02, 1976, 38 y.o.   MRN: [id number]  HPI  Alexandria Hanna is a very pleasant 38 y.o. yo female who  has a past medical history of ADHD (attention deficit hyperactivity disorder); Hyperhydrosis disorder; Allergy; AMA (advanced maternal age) multigravida 64+; Herpes; Asthma; and Carpal tunnel syndrome, bilateral. She presents today for an acute visit today regarding sinus pain.   8 days ago the patient started having a runny nose which within a day or two progressed to sinus pressure, red/itchy eyes, copious yellow/green phglem and drainage, wheezing, dry non-productive cough and a very sore throat. The patient also developed muscle aches in the neck and back a few days ago and has had a dull constant headache from the pressure in her sinues. The patient has a 6 old who has been sick for two weeks with fever, conjunctivitis, chest congestion and ear infection. The patient used some of her son's eye drops for her red eyes and had resolution of her red/itchy eyes. The patient denies any objective fever, rash or night sweats. The patient has 4-5 days of OTC allergies medications without relief.   Review of Systems is negative except per HPI.    Objective:   Physical Exam  Physical Exam  Constitutional: Patient is oriented to person, place, and time and well-developed, well-nourished, and in no distress. Eyes: Conjunctivae and EOM are normal. Pupils are equal, round, and reactive to light.  Ears: Right TM appears slightly more pink and shiny than the left Nose: nasal mucosa slightly reddish/purple Cardiovascular: Normal rate, regular rhythm. Pulmonary/Chest: Effort normal and breath sounds normal without wheezing or ronchi.  Sinus: Pain illicited with palpation of the frontal and maxillary sinuses    Assessment & Plan:   Acute sinusitis - Plan: amoxicillin (AMOXIL) 875 MG tablet  Will treat BID for 10 days.

## 2013-10-04 ENCOUNTER — Telehealth: Payer: Self-pay

## 2013-10-04 ENCOUNTER — Other Ambulatory Visit: Payer: Self-pay | Admitting: Medical

## 2013-10-04 DIAGNOSIS — J019 Acute sinusitis, unspecified: Secondary | ICD-10-CM

## 2013-10-04 MED ORDER — AMOXICILLIN 875 MG PO TABS: 875.0000 mg | ORAL_TABLET | Freq: Two times a day (BID) | ORAL | Status: DC

## 2013-10-04 NOTE — Telephone Encounter (Signed)
Pt was just seen on 09/28/13 for a sinus infection and Dr. Redmond School gave her Amoxicillan. Pt is leaving to go out of town and is still having some phlem, nasal and chest congestion and drainage and states that the amoxicillin has helped and she would like to have a few more pills to take to be on the safe side while she is away. Pt would like to know if this will be ok to have more Amoxillin Rxd for her by either Dr. Redmond School or Audelia Acton. Please advise.

## 2013-10-04 NOTE — Telephone Encounter (Signed)
Another round sent

## 2013-10-04 NOTE — Telephone Encounter (Signed)
Pt.notified

## 2013-10-05 MED ORDER — AMOXICILLIN 875 MG PO TABS: 875.0000 mg | ORAL_TABLET | Freq: Two times a day (BID) | ORAL | Status: DC

## 2013-10-05 NOTE — Telephone Encounter (Signed)
Let her know that I called another weeks worth in

## 2013-10-05 NOTE — Addendum Note (Signed)
Addended by: Denita Lung on: 10/05/2013 12:53 PM   Modules accepted: Orders

## 2013-11-16 ENCOUNTER — Telehealth: Payer: Self-pay | Admitting: Internal Medicine

## 2013-11-16 DIAGNOSIS — F909 Attention-deficit hyperactivity disorder, unspecified type: Secondary | ICD-10-CM

## 2013-11-16 MED ORDER — METHYLPHENIDATE HCL 20 MG PO TABS: 20.0000 mg | ORAL_TABLET | Freq: Two times a day (BID) | ORAL | Status: DC

## 2013-11-16 MED ORDER — METHYLPHENIDATE HCL ER (OSM) 36 MG PO TBCR: 36.0000 mg | EXTENDED_RELEASE_TABLET | Freq: Every day | ORAL | Status: DC

## 2013-11-16 MED ORDER — METHYLPHENIDATE HCL 20 MG PO TABS: 20.0000 mg | ORAL_TABLET | Freq: Every day | ORAL | Status: DC

## 2013-11-16 NOTE — Telephone Encounter (Signed)
Ritalin and Concerta renewed

## 2013-11-16 NOTE — Telephone Encounter (Signed)
Pt needs a refill on ritalin 47m, methyiphenidate 348m Call when ready

## 2013-12-27 ENCOUNTER — Other Ambulatory Visit: Payer: Self-pay

## 2013-12-27 DIAGNOSIS — Z1231 Encounter for screening mammogram for malignant neoplasm of breast: Secondary | ICD-10-CM

## 2014-01-03 ENCOUNTER — Ambulatory Visit
Admission: RE | Admit: 2014-01-03 | Discharge: 2014-01-03 | Disposition: A | Discharge status: Home / Self Care | Payer: 59 | Source: Ambulatory Visit

## 2014-01-03 DIAGNOSIS — Z1231 Encounter for screening mammogram for malignant neoplasm of breast: Secondary | ICD-10-CM

## 2014-04-02 ENCOUNTER — Telehealth: Payer: Self-pay | Admitting: Family Medicine

## 2014-04-02 DIAGNOSIS — F901 Attention-deficit hyperactivity disorder, predominantly hyperactive type: Secondary | ICD-10-CM

## 2014-04-02 MED ORDER — METHYLPHENIDATE HCL ER (OSM) 36 MG PO TBCR: 36.0000 mg | EXTENDED_RELEASE_TABLET | Freq: Every day | ORAL | Status: DC

## 2014-04-02 MED ORDER — METHYLPHENIDATE HCL 20 MG PO TABS: 20.0000 mg | ORAL_TABLET | Freq: Two times a day (BID) | ORAL | Status: DC

## 2014-04-02 MED ORDER — METHYLPHENIDATE HCL 20 MG PO TABS: 20.0000 mg | ORAL_TABLET | Freq: Every day | ORAL | Status: DC

## 2014-04-02 NOTE — Telephone Encounter (Signed)
Pt called and stated that she needs refills on ritalin and concerta. Please call [phone] when ready

## 2014-04-09 ENCOUNTER — Encounter: Payer: Self-pay | Admitting: Family Medicine

## 2014-05-01 ENCOUNTER — Other Ambulatory Visit: Payer: Self-pay | Admitting: Obstetrics and Gynecology

## 2014-05-04 LAB — CYTOLOGY - PAP

## 2014-05-21 ENCOUNTER — Encounter: Payer: Self-pay | Admitting: Family Medicine

## 2014-05-21 ENCOUNTER — Ambulatory Visit (INDEPENDENT_AMBULATORY_CARE_PROVIDER_SITE_OTHER): Payer: 59 | Admitting: Family Medicine

## 2014-05-21 DIAGNOSIS — H6691 Otitis media, unspecified, right ear: Secondary | ICD-10-CM

## 2014-05-21 DIAGNOSIS — J019 Acute sinusitis, unspecified: Secondary | ICD-10-CM

## 2014-05-21 MED ORDER — ANTIPYRINE-BENZOCAINE 5.4-1.4 % OT SOLN
2.0000 [drp] | OTIC | Status: DC | PRN
Start: 2014-05-21 — End: 2014-08-23

## 2014-05-21 MED ORDER — AMOXICILLIN 875 MG PO TABS: 875.0000 mg | ORAL_TABLET | Freq: Two times a day (BID) | ORAL | Status: DC

## 2014-05-21 NOTE — Progress Notes (Signed)
Subjective:     Patient ID: Alexandria Hanna, female   DOB: Apr 20, 1976, 38 y.o.   MRN: [id number]  HPI  This is a 38 year old female with a history of herpes labialis and ADHD who presents with two weeks of sinus issues and 1 day of ear pain.  She developed nasal congestion, drainage, sinus pressure, and intermittent frontal headaches two weeks ago, and these symptoms have been continuous.  Yesterday evening she began to notice R sided ear pain which had worsened upon awakening this morning.  Her father has a history of ear infections and gave her some antibiotic drops to irrigate her ear with.  She has used these x1 with some relief.  She's also had some stiffness and soreness of her neck on the left and right.  She denies fevers, photophobia, nausea, vomiting, changes in mental status, or changes in vision.  Review of Systems  Per HPI     Objective:   Physical Exam  General: Pleasant woman in no acute distress. HEENT: R tympanic membrane with erythema and blood about the maleus.  L Tympanic membrane normal.  Frontal and maxillary sinuses tender to palpation.  Erythematous nares.  No tonsilar enlargement or cobblestoning of posterior oropharynx.  No cervical lymphadenopathy or thyromegaly apparent.  Kernig's sign negative. Pulm: Clear bilaterally with normal work of breathing. CV: Regular rate and rhythm with normal S1/S2.    Assessment & Plan:     Acute right otitis media, recurrence not specified, unspecified otitis media type - Plan: antipyrine-benzocaine (AURALGAN) otic solution, amoxicillin (AMOXIL) 875 MG tablet  Her symptoms and physical exam are consistent with a bacterial sinusitis complicated by an acute right otits media.  She does not have exam findings or alarm symptoms concerning for an evolving meningitis.  Her reported neck pain appears musculoskeletal in origin as physical exam was unremarkable.

## 2014-06-05 ENCOUNTER — Telehealth: Payer: Self-pay | Admitting: Family Medicine

## 2014-06-05 MED ORDER — AMOXICILLIN-POT CLAVULANATE 875-125 MG PO TABS: 1.0000 | ORAL_TABLET | Freq: Two times a day (BID) | ORAL | Status: DC

## 2014-06-05 NOTE — Telephone Encounter (Signed)
Pt called and stated that she was seen last week and is still sick. She states her ears are better but she is still having sinus problems. Please advise. Pt uses [facility] on [address].

## 2014-06-05 NOTE — Telephone Encounter (Signed)
Let her know that I called a different antibiotic in

## 2014-06-05 NOTE — Telephone Encounter (Signed)
Patient informed. 

## 2014-08-23 ENCOUNTER — Encounter: Payer: Self-pay | Admitting: Medical

## 2014-08-23 ENCOUNTER — Ambulatory Visit (INDEPENDENT_AMBULATORY_CARE_PROVIDER_SITE_OTHER): Payer: 59 | Admitting: Medical

## 2014-08-23 VITALS — BP 110/70 | HR 78 | Temp 98.2°F | Resp 15 | Wt 145.5 lb

## 2014-08-23 DIAGNOSIS — J0100 Acute maxillary sinusitis, unspecified: Secondary | ICD-10-CM

## 2014-08-23 MED ORDER — AMOXICILLIN-POT CLAVULANATE 875-125 MG PO TABS: 1.0000 | ORAL_TABLET | Freq: Two times a day (BID) | ORAL | Status: DC

## 2014-08-23 NOTE — Progress Notes (Signed)
Subjective: Here for sinus infection.  Has been having sinus pressure, sore throat, aching in head,dizziness, dry cough, post nasal drainage, and has had symptoms for 3 weeks.  Her 10 mos old at home who has been sick as well with ear infections.  Been using some Advil cold and sinus.  Patient is a non-smoker.  Using some nasal saline.  No other aggravating or relieving factors.  No other c/o.  ROS as in subjective   Objective: Filed Vitals:   08/23/14 0811  BP: 110/70  Pulse: 78  Temp: 98.2 F (36.8 C)  Resp: 15    General appearance: Alert, WD/WN, no distress                             Skin: warm, no rash                           Head: + frontal sinus tenderness,                            Eyes: conjunctiva normal, corneas clear, PERRLA                            Ears: pearly TMs, external ear canals normal                          Nose: septum midline, turbinates swollen, with erythema and clear discharge             Mouth/throat: MMM, tongue normal, mild pharyngeal erythema                           Neck: supple, no adenopathy, no thyromegaly, nontender                         Lungs: CTA bilaterally, no wheezes, rales, or rhonchi      Assessment and Plan:  Encounter Diagnosis  Name Primary?  . Acute maxillary sinusitis, recurrence not specified Yes    Prescription given for Augmentin.  Can use OTC Mucinex for congestion.  Tylenol or Ibuprofen OTC for fever and malaise.  Discussed symptomatic relief, nasal saline flush, and call or return if worse or not improving in 2-3 days.

## 2014-09-28 ENCOUNTER — Other Ambulatory Visit: Payer: Self-pay | Admitting: Dermatology

## 2014-10-12 ENCOUNTER — Telehealth: Payer: Self-pay | Admitting: Family Medicine

## 2014-10-12 DIAGNOSIS — F901 Attention-deficit hyperactivity disorder, predominantly hyperactive type: Secondary | ICD-10-CM

## 2014-10-12 MED ORDER — METHYLPHENIDATE HCL ER (OSM) 36 MG PO TBCR: 36.0000 mg | EXTENDED_RELEASE_TABLET | Freq: Every day | ORAL | Status: DC

## 2014-10-12 MED ORDER — METHYLPHENIDATE HCL 20 MG PO TABS: 20.0000 mg | ORAL_TABLET | Freq: Two times a day (BID) | ORAL | Status: DC

## 2014-10-12 MED ORDER — METHYLPHENIDATE HCL 20 MG PO TABS: 20.0000 mg | ORAL_TABLET | Freq: Every day | ORAL | Status: DC

## 2014-10-12 NOTE — Telephone Encounter (Signed)
Pt needs refill Ritalin & Concerta

## 2014-10-15 ENCOUNTER — Telehealth: Payer: Self-pay | Admitting: Family Medicine

## 2014-10-15 NOTE — Telephone Encounter (Signed)
P.A. Wouldn't go thru states more than one member found with name and date of birth provided  So Called [facility] and Concerta Brand approved (cheaper) and Ritalin generic approved. Called pt & she states she can't take Ritalin gerneric, had bad side effects and must have brand. Called [facility] back & brand Ritalin approved.

## 2015-01-17 ENCOUNTER — Other Ambulatory Visit: Payer: Self-pay

## 2015-01-17 DIAGNOSIS — Z1231 Encounter for screening mammogram for malignant neoplasm of breast: Secondary | ICD-10-CM

## 2015-01-25 ENCOUNTER — Ambulatory Visit
Admission: RE | Admit: 2015-01-25 | Discharge: 2015-01-25 | Disposition: A | Discharge status: Home / Self Care | Payer: 59 | Source: Ambulatory Visit

## 2015-01-25 DIAGNOSIS — Z1231 Encounter for screening mammogram for malignant neoplasm of breast: Secondary | ICD-10-CM

## 2015-02-05 ENCOUNTER — Ambulatory Visit (INDEPENDENT_AMBULATORY_CARE_PROVIDER_SITE_OTHER): Payer: 59 | Admitting: Family Medicine

## 2015-02-05 ENCOUNTER — Encounter: Payer: Self-pay | Admitting: Family Medicine

## 2015-02-05 VITALS — BP 118/72 | HR 68 | Temp 98.9°F | Wt 145.8 lb

## 2015-02-05 DIAGNOSIS — Z8709 Personal history of other diseases of the respiratory system: Secondary | ICD-10-CM | POA: Diagnosis not present

## 2015-02-05 DIAGNOSIS — J029 Acute pharyngitis, unspecified: Secondary | ICD-10-CM

## 2015-02-05 DIAGNOSIS — J019 Acute sinusitis, unspecified: Secondary | ICD-10-CM

## 2015-02-05 DIAGNOSIS — B9689 Other specified bacterial agents as the cause of diseases classified elsewhere: Secondary | ICD-10-CM

## 2015-02-05 MED ORDER — AMOXICILLIN-POT CLAVULANATE 875-125 MG PO TABS: 1.0000 | ORAL_TABLET | Freq: Two times a day (BID) | ORAL | Status: DC

## 2015-02-05 NOTE — Progress Notes (Signed)
   Subjective:    Patient ID: Alexandria Hanna, female    DOB: 1976-04-15, 39 y.o.   MRN: [id number]  HPI She is here for 2 week history of sinus pressure, ears popping, and feeling achy. She also reports she has gotten worse in the past 2-3 days and has had low-grade fever and chills, sore throat, and dry cough. She also reports a runny nose and post nasal drainage. She states her husband and 21 year old child have also been sick. She does not smoke. She has a history of asthma but has not needed her albuterol inhaler during this illness. She denies malaise, GI or GU symptoms.   Reviewed allergies, medications, past medical history, social history  Review of Systems Pertinent positives and negatives in the history of present illness.    Objective:   Physical Exam  Alert and in no distress. Sinus tenderness to maxillary and ethmoid. Left TM with mild bulging, no fluid, Right TM normal, canals are normal. Pharyngeal area is moderately erythematous without exudate or edema. Mucous membranes are moist. Neck is supple without adenopathy. Cardiac exam shows a regular sinus rhythm without murmurs or gallops. Lungs are clear to auscultation.  Rapid strep test negative     Assessment & Plan:  Acute bacterial rhinosinusitis - Plan: amoxicillin-clavulanate (AUGMENTIN) 875-125 MG per tablet  Acute pharyngitis, unspecified pharyngitis type  History of asthma  Her rapid strep test was negative. Due to the length of her illness and that she appears to be getting worse, I suspect that she has a bacterial infection and will prescribe an antibiotic. Discussed that she should hydrate and rest. She may also try salt water gargles and warm fluids to soothe her throat. She will let us know if she is not completely back to her baseline after completing the antibiotic.

## 2015-02-05 NOTE — Patient Instructions (Signed)
Your strep test was negative. Drink plenty of fluids and stay hydrated. Rest. Let us know if you are not completely better after completing the antibiotic.    Pharyngitis Pharyngitis is redness, pain, and swelling (inflammation) of your pharynx.  CAUSES  Pharyngitis is usually caused by infection. Most of the time, these infections are from viruses (viral) and are part of a cold. However, sometimes pharyngitis is caused by bacteria (bacterial). Pharyngitis can also be caused by allergies. Viral pharyngitis may be spread from person to person by coughing, sneezing, and personal items or utensils (cups, forks, spoons, toothbrushes). Bacterial pharyngitis may be spread from person to person by more intimate contact, such as kissing.  SIGNS AND SYMPTOMS  Symptoms of pharyngitis include:   Sore throat.   Tiredness (fatigue).   Low-grade fever.   Headache.  Joint pain and muscle aches.  Skin rashes.  Swollen lymph nodes.  Plaque-like film on throat or tonsils (often seen with bacterial pharyngitis). DIAGNOSIS  Your health care provider will ask you questions about your illness and your symptoms. Your medical history, along with a physical exam, is often all that is needed to diagnose pharyngitis. Sometimes, a rapid strep test is done. Other lab tests may also be done, depending on the suspected cause.  TREATMENT  Viral pharyngitis will usually get better in 3-4 days without the use of medicine. Bacterial pharyngitis is treated with medicines that kill germs (antibiotics).  HOME CARE INSTRUCTIONS   Drink enough water and fluids to keep your urine clear or pale yellow.   Only take over-the-counter or prescription medicines as directed by your health care provider:   If you are prescribed antibiotics, make sure you finish them even if you start to feel better.   Do not take aspirin.   Get lots of rest.   Gargle with 8 oz of salt water ( tsp of salt per 1 qt of water) as  often as every 1-2 hours to soothe your throat.   Throat lozenges (if you are not at risk for choking) or sprays may be used to soothe your throat. SEEK MEDICAL CARE IF:   You have large, tender lumps in your neck.  You have a rash.  You cough up green, yellow-brown, or bloody spit. SEEK IMMEDIATE MEDICAL CARE IF:   Your neck becomes stiff.  You drool or are unable to swallow liquids.  You vomit or are unable to keep medicines or liquids down.  You have severe pain that does not go away with the use of recommended medicines.  You have trouble breathing (not caused by a stuffy nose). MAKE SURE YOU:   Understand these instructions.  Will watch your condition.  Will get help right away if you are not doing well or get worse. Document Released: 05/25/2005 Document Revised: 03/15/2013 Document Reviewed: 01/30/2013 [facility] Patient Information 2015 [place], [place]. This information is not intended to replace advice given to you by your health care provider. Make sure you discuss any questions you have with your health care provider.

## 2015-02-06 LAB — POCT RAPID STREP A (OFFICE): Rapid Strep A Screen: NEGATIVE

## 2015-02-06 NOTE — Addendum Note (Signed)
Addended by: Minette Headland A on: 02/06/2015 10:58 AM   Modules accepted: Orders

## 2015-02-07 ENCOUNTER — Telehealth: Payer: Self-pay | Admitting: Family Medicine

## 2015-02-07 NOTE — Telephone Encounter (Signed)
Please call her tomorrow and let her know.....what is in my telephone outgoing message. Thank you

## 2015-02-07 NOTE — Telephone Encounter (Signed)
Attempted to call cell phone but got voicemail. Would advise her to continue antibiotic and that it may take 3-5 days on the antibiotic for her to really tell a difference. In the meantime she should treat her symptoms with Mucinex or decongestants, salt water gargles etc. Rest and hydrate is key. If she gets worse with high fever or worsening productive cough, she may need a chest XR but otherwise she should stay the course.

## 2015-02-07 NOTE — Telephone Encounter (Signed)
Pt states she is worse, now gone into her chest, major runny nose, coughing.  Wants to know what she can do next?

## 2015-02-08 ENCOUNTER — Telehealth: Payer: Self-pay | Admitting: Family Medicine

## 2015-02-08 NOTE — Telephone Encounter (Signed)
Left detailed message with Vickie's instructions

## 2015-02-18 ENCOUNTER — Telehealth: Payer: Self-pay | Admitting: Family Medicine

## 2015-02-18 DIAGNOSIS — J019 Acute sinusitis, unspecified: Principal | ICD-10-CM

## 2015-02-18 DIAGNOSIS — B9689 Other specified bacterial agents as the cause of diseases classified elsewhere: Secondary | ICD-10-CM

## 2015-02-18 MED ORDER — AMOXICILLIN-POT CLAVULANATE 875-125 MG PO TABS: 1.0000 | ORAL_TABLET | Freq: Two times a day (BID) | ORAL | Status: DC

## 2015-02-18 NOTE — Telephone Encounter (Signed)
Please call her and see if she thinks she needs to be seen again or not? I did prescribe Augmentin and that is what she took for 2 weeks. Since she seems to be getting worse again maybe we should see her.

## 2015-02-18 NOTE — Telephone Encounter (Signed)
Pt says sinus infection started to get better but is now starting to get worse again. Her face, cheek, and teeth hurt. She has congestion and drainage. She would like Augmentin or something stronger that Amoxicillin. Pt says she thinks she has taken Augmentin before or had antibiotics for a longer course.

## 2015-02-18 NOTE — Telephone Encounter (Signed)
Pt states she only had a 7 day supply of meds instead of 10 and was better then on 8th day she got worse again. She would like another round of antibiotic  Per Vickie- okay to send in a 10 day supply of 2nd round of antibiotic and if pt is not better after this she needs to come back in   Pt was aware of this and will come back in if not better

## 2015-02-20 ENCOUNTER — Ambulatory Visit (INDEPENDENT_AMBULATORY_CARE_PROVIDER_SITE_OTHER): Payer: 59 | Admitting: Family Medicine

## 2015-02-20 ENCOUNTER — Encounter: Payer: Self-pay | Admitting: Family Medicine

## 2015-02-20 VITALS — BP 114/80 | HR 60 | Temp 98.1°F | Wt 147.0 lb

## 2015-02-20 DIAGNOSIS — J0141 Acute recurrent pansinusitis: Secondary | ICD-10-CM | POA: Diagnosis not present

## 2015-02-20 MED ORDER — METHYLPREDNISOLONE SODIUM SUCC 125 MG IJ SOLR
125.0000 mg | Freq: Once | INTRAMUSCULAR | Status: AC
  Administered 2015-02-20: 125 mg via INTRAMUSCULAR

## 2015-02-20 NOTE — Progress Notes (Signed)
   Subjective:    Patient ID: Alexandria Hanna, female    DOB: 04-Jan-1976, 39 y.o.   MRN: [id number]  HPI She is here for an approximately 3 week history sinus pressure and congestion/drainage, ear pressure, dry cough. She completed one round of ABX for sore throat and sinus pressure and states her sore throat went away but the congestion and pressure has been persistent. She states her cough has improved but continues to have a dry cough. Denies fever, chills, shortness of breath. She is on day 3 of her second round of ABX and came in today requesting steroids to hopefully speed up her recovery. She has been using saline nasal spray and taking Tylenol or aleve for discomfort. Has not tried decongestants or antihistamines. Does not smoke.    Review of Systems Pertinent positives and negatives in the history of present illness.    Objective:   Physical Exam  Constitutional: She appears well-developed and well-nourished. No distress.  HENT:  Head: Normocephalic.  Right Ear: A middle ear effusion is present.  Left Ear: There is tenderness. A middle ear effusion is present.  Nose: Mucosal edema present.  Mouth/Throat: Uvula is midline and mucous membranes are normal. Posterior oropharyngeal erythema present. No oropharyngeal exudate or posterior oropharyngeal edema.  Tenderness to maxillary sinuses.  Cardiovascular: Normal rate, regular rhythm and normal heart sounds.   Pulmonary/Chest: Effort normal and breath sounds normal. She has no wheezes. She has no rhonchi.  Lymphadenopathy:       Head (right side): No submandibular, no tonsillar, no preauricular, no posterior auricular and no occipital adenopathy present.       Head (left side): No submandibular, no tonsillar, no preauricular, no posterior auricular and no occipital adenopathy present.    She has no cervical adenopathy.  Skin: Skin is warm and dry. No rash noted.          Assessment & Plan:  Recurrent pansinusitis, unspecified  chronicity - Plan: methylPREDNISolone sodium succinate (SOLU-MEDROL) 125 mg/2 mL injection 125 mg  Steroid given IM in office. Encouraged her to try OTC decongestant and continue to hydrate and rest. She will let us know if she is not better in 2-3 days. Discussed that she has improved however has not gotten completely better and that this may take a little longer.

## 2015-02-20 NOTE — Patient Instructions (Signed)

## 2015-03-06 ENCOUNTER — Telehealth: Payer: Self-pay | Admitting: Family Medicine

## 2015-03-06 DIAGNOSIS — F901 Attention-deficit hyperactivity disorder, predominantly hyperactive type: Secondary | ICD-10-CM

## 2015-03-06 NOTE — Telephone Encounter (Signed)
Requesting refill on Concerta 36 mg and Ritalin 20 mg. Call pt at [phone] when script is ready for pick up

## 2015-03-07 MED ORDER — METHYLPHENIDATE HCL 20 MG PO TABS: 20.0000 mg | ORAL_TABLET | Freq: Two times a day (BID) | ORAL | Status: DC

## 2015-03-07 MED ORDER — METHYLPHENIDATE HCL ER (OSM) 36 MG PO TBCR: 36.0000 mg | EXTENDED_RELEASE_TABLET | Freq: Every day | ORAL | Status: DC

## 2015-03-07 MED ORDER — METHYLPHENIDATE HCL 20 MG PO TABS: 20.0000 mg | ORAL_TABLET | Freq: Every day | ORAL | Status: DC

## 2015-05-27 ENCOUNTER — Encounter: Payer: Self-pay | Admitting: Family Medicine

## 2015-05-27 ENCOUNTER — Ambulatory Visit (INDEPENDENT_AMBULATORY_CARE_PROVIDER_SITE_OTHER): Payer: 59 | Admitting: Family Medicine

## 2015-05-27 VITALS — BP 114/72 | HR 78 | Resp 14 | Wt 148.8 lb

## 2015-05-27 DIAGNOSIS — J0100 Acute maxillary sinusitis, unspecified: Secondary | ICD-10-CM

## 2015-05-27 DIAGNOSIS — F901 Attention-deficit hyperactivity disorder, predominantly hyperactive type: Secondary | ICD-10-CM | POA: Diagnosis not present

## 2015-05-27 DIAGNOSIS — J45990 Exercise induced bronchospasm: Secondary | ICD-10-CM

## 2015-05-27 DIAGNOSIS — Z8709 Personal history of other diseases of the respiratory system: Secondary | ICD-10-CM

## 2015-05-27 DIAGNOSIS — J209 Acute bronchitis, unspecified: Secondary | ICD-10-CM

## 2015-05-27 MED ORDER — AMOXICILLIN-POT CLAVULANATE 875-125 MG PO TABS: 1.0000 | ORAL_TABLET | Freq: Two times a day (BID) | ORAL | Status: DC

## 2015-05-27 MED ORDER — ALBUTEROL SULFATE HFA 108 (90 BASE) MCG/ACT IN AERS: 2.0000 | INHALATION_SPRAY | Freq: Four times a day (QID) | RESPIRATORY_TRACT | Status: DC | PRN

## 2015-05-27 NOTE — Progress Notes (Signed)
   Subjective:    Patient ID: Ardean Larsen, female    DOB: [birth year], 39 y.o.   MRN: [id number]  HPI She is here for evaluation of a two-week history that started with sinus congestion, slight sore throat sinus pressure with headache PND and slight sore throat. No fever, chills, earache.She does have a previous history of difficulty with allergie well as asthma especially exercise-induced asthma. No fever, chills, earache. She also has an underlying history of ADD.Presently she is supposed to be taking a long-acting and short-acting medication. She describes taking a half a pill of the short acting one, then taking the Concerta midmorning and then potentially using another one later in the day. This was not the regimen that she was originally placed on.   Review of Systems     Objective:   Physical Exam Alert and in no distress. Tympanic membranes and canals are normal. Pharyngeal area is normal. Neck is supple without adenopathy or thyromegaly. Cardiac exam shows a regular sinus rhythm without murmurs or gallops. Lungs are clear to auscultation.Nasal mucosa is tender over frontal and maxillary sinuses.        Assessment & Plan:  Acute maxillary sinusitis, recurrence not specified - Plan: amoxicillin-clavulanate (AUGMENTIN) 875-125 MG tablet  History of asthma - Plan: albuterol (PROVENTIL HFA;VENTOLIN HFA) 108 (90 BASE) MCG/ACT inhaler  Acute bronchitis, unspecified organism - Plan: amoxicillin-clavulanate (AUGMENTIN) 875-125 MG tablet  Attention-deficit hyperactivity disorder, predominantly hyperactive type  Exercise-induced asthma She will call if not entirely better when she finishes the antibiotic. Also recommend she take t ADD medicine as we prescribed meaning take the Concerta in the morning when she gets up and see how long it lasts and then also take the short acting one on an as-needed basis. She is to call in several weeks and let me know how this is working.

## 2015-06-11 ENCOUNTER — Other Ambulatory Visit: Payer: Self-pay | Admitting: Family Medicine

## 2015-06-11 ENCOUNTER — Telehealth: Payer: Self-pay

## 2015-06-11 DIAGNOSIS — J209 Acute bronchitis, unspecified: Secondary | ICD-10-CM

## 2015-06-11 DIAGNOSIS — J0100 Acute maxillary sinusitis, unspecified: Secondary | ICD-10-CM

## 2015-06-11 MED ORDER — AMOXICILLIN-POT CLAVULANATE 875-125 MG PO TABS
1.0000 | ORAL_TABLET | Freq: Two times a day (BID) | ORAL | Status: DC
Start: 2015-06-11 — End: 2016-01-22

## 2015-06-11 NOTE — Telephone Encounter (Signed)
Pt was here last week and she finished her antibiotic Thursday, she felt good Friday-Sunday but by Monday night her chest started tightening up again, has drainage and still coughing up phlem. She wants to know if you want to see her or give her more antibiotics or what?

## 2015-06-11 NOTE — Telephone Encounter (Signed)
I will  call in another dose of the antibiotic

## 2015-06-12 NOTE — Telephone Encounter (Signed)
Pt informed

## 2015-06-26 ENCOUNTER — Telehealth: Payer: Self-pay | Admitting: Family Medicine

## 2015-06-26 DIAGNOSIS — F901 Attention-deficit hyperactivity disorder, predominantly hyperactive type: Secondary | ICD-10-CM

## 2015-06-26 NOTE — Telephone Encounter (Signed)
Pt called requesting a refill on her concerta and ritalin 20 mg and pt can be reached at [phone] when ready to be picked up.

## 2015-06-27 MED ORDER — METHYLPHENIDATE HCL 20 MG PO TABS: 20.0000 mg | ORAL_TABLET | Freq: Every day | ORAL | Status: DC

## 2015-06-27 MED ORDER — METHYLPHENIDATE HCL ER (OSM) 36 MG PO TBCR: 36.0000 mg | EXTENDED_RELEASE_TABLET | Freq: Every day | ORAL | Status: DC

## 2015-06-27 NOTE — Telephone Encounter (Signed)
Left message that Rx x6 ready for pick up

## 2015-08-30 ENCOUNTER — Telehealth: Payer: Self-pay | Admitting: Family Medicine

## 2015-08-30 MED ORDER — OSELTAMIVIR PHOSPHATE 75 MG PO CAPS: 75.0000 mg | ORAL_CAPSULE | Freq: Every day | ORAL | Status: DC

## 2015-08-30 NOTE — Telephone Encounter (Signed)
Pt's son was diagnosed with the flu today. Can she get a script for Tamiflu for preventative measures?

## 2015-08-30 NOTE — Telephone Encounter (Signed)
Please make sure she is aware of potential side effects of Tamiflu such as nausea, headache and basically flu-like symptoms and the rare but possible CNS side effects such as abnormal thoughts or hallucinations.  She needs to start this within 48 hours of exposure or it is not effective for prevention.  Prescribe 75 mg daily 10 days no refills if she meets the criteria and wants it. Thanks.

## 2015-08-30 NOTE — Telephone Encounter (Signed)
Pt was aware and sent med to pharmacy

## 2015-10-08 ENCOUNTER — Telehealth: Payer: Self-pay | Admitting: Family Medicine

## 2015-10-08 DIAGNOSIS — F901 Attention-deficit hyperactivity disorder, predominantly hyperactive type: Secondary | ICD-10-CM

## 2015-10-08 MED ORDER — METHYLPHENIDATE HCL ER (OSM) 36 MG PO TBCR: 36.0000 mg | EXTENDED_RELEASE_TABLET | Freq: Every day | ORAL | Status: DC

## 2015-10-08 MED ORDER — METHYLPHENIDATE HCL 20 MG PO TABS: 20.0000 mg | ORAL_TABLET | Freq: Every day | ORAL | Status: DC

## 2015-10-08 NOTE — Telephone Encounter (Signed)
Pt called for refills of concerta and ritalin. Please call [phone] when ready.

## 2015-10-17 ENCOUNTER — Telehealth: Payer: Self-pay | Admitting: Family Medicine

## 2015-10-17 NOTE — Telephone Encounter (Signed)
Initiated P.A. Brand Ritalin

## 2015-10-17 NOTE — Telephone Encounter (Signed)
Pt states her insurance is requiring P.A. For Brand Ritalin.  States she has been on brand for over 20 years.  She is unable to take the generic due to side effects of it makes her feel strange, feels crazy, makes her skin crawl and fingers tingle and she is unable to take it.  She is going to email me a copy of her insurance card.  She spoke with [employer] & they told her she needs a letter stating that the brand is medically necessary and stating her history with this medication.  She does not want to take a chance and even try the generic again due to the problems she had before.

## 2015-10-18 NOTE — Telephone Encounter (Signed)
Cover my meds states approved, I called pharmacy & went thru for $41.46, pt informed

## 2016-01-22 ENCOUNTER — Encounter: Payer: Self-pay | Admitting: Family Medicine

## 2016-01-22 ENCOUNTER — Ambulatory Visit (INDEPENDENT_AMBULATORY_CARE_PROVIDER_SITE_OTHER): Payer: BLUE CROSS/BLUE SHIELD | Admitting: Family Medicine

## 2016-01-22 DIAGNOSIS — F901 Attention-deficit hyperactivity disorder, predominantly hyperactive type: Secondary | ICD-10-CM | POA: Diagnosis not present

## 2016-01-22 MED ORDER — METHYLPHENIDATE HCL ER (OSM) 36 MG PO TBCR: 36.0000 mg | EXTENDED_RELEASE_TABLET | Freq: Every day | ORAL | 0 refills | Status: DC

## 2016-01-22 MED ORDER — METHYLPHENIDATE HCL 20 MG PO TABS
20.0000 mg | ORAL_TABLET | Freq: Every day | ORAL | 0 refills | Status: DC
Start: 2016-01-22 — End: 2016-05-08

## 2016-01-22 MED ORDER — METHYLPHENIDATE HCL 20 MG PO TABS: 20.0000 mg | ORAL_TABLET | Freq: Every day | ORAL | 0 refills | Status: DC

## 2016-01-22 NOTE — Progress Notes (Signed)
   Subjective:    Patient ID: Alexandria Hanna, female    DOB: [birth year], 40 y.o.   MRN: [id number]  HPI She is here for a med check appointment concerning her ADD medication. She continues on Concerta which lasts roughly 6 hours. She now is taking Ritalin towards the end of the 6 hours with a repeat roughly 3 hours later. When the Ritalin wears off, she does get fatigued and irritable. She states that sometimes she would like to have extra medication.  Review of Systems     Objective:   Physical Exam Alert and in no distress otherwise not examined       Assessment & Plan:  Attention-deficit hyperactivity disorder, predominantly hyperactive type - Plan: methylphenidate (RITALIN) 20 MG tablet, methylphenidate (RITALIN) 20 MG tablet, methylphenidate (RITALIN) 20 MG tablet, methylphenidate (CONCERTA) 36 MG PO CR tablet, methylphenidate (CONCERTA) 36 MG PO CR tablet, methylphenidate (CONCERTA) 36 MG PO CR tablet I discussed the use of the Concerta and Ritalin. She seems to have a good approach towards this. Usually when she wants extra is when she has started taking her medication early when her son wakes up. I strongly encouraged her to maintain her regular regimen and not try to use more of the medication. At this point she is having no trouble with sleep issues. Also recommend that on days that she does not need to use the Concerta or Ritalin medications, to not use them so she has extras for use when she does need to stay focused longer.

## 2016-01-24 LAB — HM MAMMOGRAPHY: HM MAMMO: NORMAL (ref 0–4)

## 2016-03-12 ENCOUNTER — Telehealth: Payer: Self-pay | Admitting: Family Medicine

## 2016-03-12 MED ORDER — TRAMADOL HCL 50 MG PO TABS: 50.0000 mg | ORAL_TABLET | Freq: Four times a day (QID) | ORAL | 0 refills | Status: DC | PRN

## 2016-03-12 NOTE — Telephone Encounter (Signed)
Call in Tramadol per Dr. Redmond School, called pt & informed

## 2016-03-12 NOTE — Telephone Encounter (Signed)
Pt states son had hand, foot and mouth last week and now she has it.  Said hands feet are covered with blisters, hurts so bad she can't walk.  She has taken Tylenol and Advil for pain every 4 hours alternating & it's not touching the pain.  Wants to know what she can do, please call ASAP

## 2016-05-01 LAB — HM PAP SMEAR: HM Pap smear: NORMAL

## 2016-05-08 ENCOUNTER — Telehealth: Payer: Self-pay | Admitting: Family Medicine

## 2016-05-08 DIAGNOSIS — F901 Attention-deficit hyperactivity disorder, predominantly hyperactive type: Secondary | ICD-10-CM

## 2016-05-08 MED ORDER — METHYLPHENIDATE HCL 20 MG PO TABS: 20.0000 mg | ORAL_TABLET | Freq: Every day | ORAL | 0 refills | Status: DC

## 2016-05-08 MED ORDER — METHYLPHENIDATE HCL ER (OSM) 36 MG PO TBCR: 36.0000 mg | EXTENDED_RELEASE_TABLET | Freq: Every day | ORAL | 0 refills | Status: DC

## 2016-05-08 NOTE — Telephone Encounter (Signed)
Pt called and is requesting a refill on her RX concerta and Ritalin pt can be reached at [phone] when ready to be picked up, informed pt that you was out of the office today and would return on Monday

## 2016-05-12 ENCOUNTER — Telehealth: Payer: Self-pay

## 2016-05-12 NOTE — Telephone Encounter (Signed)
Lm on pt VCM that scripts for Ritalin and Concerta at front desk ready for pick up. Alexandria Hanna

## 2016-06-03 ENCOUNTER — Ambulatory Visit (INDEPENDENT_AMBULATORY_CARE_PROVIDER_SITE_OTHER): Payer: BLUE CROSS/BLUE SHIELD | Admitting: Family Medicine

## 2016-06-03 ENCOUNTER — Encounter: Payer: Self-pay | Admitting: Family Medicine

## 2016-06-03 VITALS — BP 112/70 | HR 79 | Ht 68.0 in | Wt 146.4 lb

## 2016-06-03 DIAGNOSIS — Z0000 Encounter for general adult medical examination without abnormal findings: Secondary | ICD-10-CM | POA: Diagnosis not present

## 2016-06-03 DIAGNOSIS — Z23 Encounter for immunization: Secondary | ICD-10-CM | POA: Diagnosis not present

## 2016-06-03 DIAGNOSIS — Z8709 Personal history of other diseases of the respiratory system: Secondary | ICD-10-CM

## 2016-06-03 DIAGNOSIS — J45990 Exercise induced bronchospasm: Secondary | ICD-10-CM

## 2016-06-03 DIAGNOSIS — F901 Attention-deficit hyperactivity disorder, predominantly hyperactive type: Secondary | ICD-10-CM

## 2016-06-03 LAB — CBC WITH DIFFERENTIAL/PLATELET
BASOS ABS: 0 {cells}/uL (ref 0–200)
BASOS PCT: 0 %
EOS ABS: 108 {cells}/uL (ref 15–500)
Eosinophils Relative: 3 %
HEMATOCRIT: 41.2 % (ref 35.0–45.0)
HEMOGLOBIN: 13.9 g/dL (ref 11.7–15.5)
LYMPHS ABS: 972 {cells}/uL (ref 850–3900)
Lymphocytes Relative: 27 %
MCH: 32.9 pg (ref 27.0–33.0)
MCHC: 33.7 g/dL (ref 32.0–36.0)
MCV: 97.4 fL (ref 80.0–100.0)
MONO ABS: 468 {cells}/uL (ref 200–950)
MONOS PCT: 13 %
MPV: 10.1 fL (ref 7.5–12.5)
NEUTROS ABS: 2052 {cells}/uL (ref 1500–7800)
Neutrophils Relative %: 57 %
Platelets: 204 10*3/uL (ref 140–400)
RBC: 4.23 MIL/uL (ref 3.80–5.10)
RDW: 12.7 % (ref 11.0–15.0)
WBC: 3.6 10*3/uL — ABNORMAL LOW (ref 4.0–10.5)

## 2016-06-03 LAB — COMPREHENSIVE METABOLIC PANEL
ALBUMIN: 4.3 g/dL (ref 3.6–5.1)
ALK PHOS: 55 U/L (ref 33–115)
ALT: 10 U/L (ref 6–29)
AST: 15 U/L (ref 10–30)
BUN: 9 mg/dL (ref 7–25)
CALCIUM: 8.9 mg/dL (ref 8.6–10.2)
CHLORIDE: 105 mmol/L (ref 98–110)
CO2: 24 mmol/L (ref 20–31)
Creat: 0.7 mg/dL (ref 0.50–1.10)
Glucose, Bld: 94 mg/dL (ref 65–99)
POTASSIUM: 4.5 mmol/L (ref 3.5–5.3)
Sodium: 139 mmol/L (ref 135–146)
TOTAL PROTEIN: 6.4 g/dL (ref 6.1–8.1)
Total Bilirubin: 0.6 mg/dL (ref 0.2–1.2)

## 2016-06-03 LAB — LIPID PANEL
CHOL/HDL RATIO: 2.4 ratio (ref <5.0)
CHOLESTEROL: 188 mg/dL (ref <200)
HDL: 77 mg/dL (ref >50)
LDL Cholesterol: 99 mg/dL (ref <100)
TRIGLYCERIDES: 61 mg/dL (ref <150)
VLDL: 12 mg/dL (ref <30)

## 2016-06-03 MED ORDER — METHYLPHENIDATE HCL 20 MG PO TABS: 20.0000 mg | ORAL_TABLET | Freq: Three times a day (TID) | ORAL | 0 refills | Status: DC

## 2016-06-03 MED ORDER — ALBUTEROL SULFATE HFA 108 (90 BASE) MCG/ACT IN AERS
2.0000 | INHALATION_SPRAY | Freq: Four times a day (QID) | RESPIRATORY_TRACT | 0 refills | Status: DC | PRN
Start: 2016-06-03 — End: 2018-08-22

## 2016-06-03 NOTE — Progress Notes (Signed)
   Subjective:    Patient ID: Alexandria Hanna, female    DOB: [birth year], 40 y.o.   MRN: [id number]  HPI She is here for complete examination. She is going to be giving up her insurance and only having catastrophic insurance. She would like to switch from Concerta to plain Ritalin. This will be a significant cost savings for her. She also has underlying asthma with exercise-induced asthma and would like a refill on her albuterol. She has recently seen her gynecologist. Does have a previous history of herpes labialis but no outbreaks recently. Her work and home life are going quite well. Family and social history as well as health maintenance and immunizations were reviewed.   Review of Systems  All other systems reviewed and are negative.      Objective:   Physical Exam BP 112/70   Pulse 79   Ht 5' 8"  (1.727 m)   Wt 146 lb 6.4 oz (66.4 kg)   BMI 22.26 kg/m   General Appearance:    Alert, cooperative, no distress, appears stated age  Head:    Normocephalic, without obvious abnormality, atraumatic  Eyes:    PERRL, conjunctiva/corneas clear, EOM's intact, fundi    benign  Ears:    Normal TM's and external ear canals  Nose:   Nares normal, mucosa normal, no drainage or sinus   tenderness  Throat:   Lips, mucosa, and tongue normal; teeth and gums normal  Neck:   Supple, no lymphadenopathy;  thyroid:  no   enlargement/tenderness/nodules; no carotid   bruit or JVD     Lungs:     Clear to auscultation bilaterally without wheezes, rales or     ronchi; respirations unlabored      Heart:    Regular rate and rhythm, S1 and S2 normal, no murmur, rub   or gallop  Breast Exam:    Deferred to GYN  Abdomen:     Soft, non-tender, nondistended, normoactive bowel sounds,    no masses, no hepatosplenomegaly  Genitalia:    Deferred to GYN     Extremities:   No clubbing, cyanosis or edema  Pulses:   2+ and symmetric all extremities  Skin:   Skin color, texture, turgor normal, no rashes or lesions    Lymph nodes:   Cervical, supraclavicular, and axillary nodes normal  Neurologic:   CNII-XII intact, normal strength, sensation and gait; reflexes 2+ and symmetric throughout          Psych:   Normal mood, affect, hygiene and grooming.          Assessment & Plan:  Routine general medical examination at a health care facility - Plan: CBC with Differential/Platelet, Comprehensive metabolic panel, Lipid panel  Attention-deficit hyperactivity disorder, predominantly hyperactive type - Plan: methylphenidate (RITALIN) 20 MG tablet  Need for prophylactic vaccination with combined diphtheria-tetanus-pertussis (DTP) vaccine - Plan: Tdap vaccine greater than or equal to 7yo IM  History of asthma - Plan: albuterol (PROVENTIL HFA;VENTOLIN HFA) 108 (90 Base) MCG/ACT inhaler, CBC with Differential/Platelet, Comprehensive metabolic panel, Lipid panel  Exercise-induced asthma I will continue to work with her concerning her care specialist since she is going to be without adequate medical coverage.

## 2016-10-05 ENCOUNTER — Other Ambulatory Visit: Payer: Self-pay | Admitting: Family Medicine

## 2016-10-05 ENCOUNTER — Telehealth: Payer: Self-pay | Admitting: Family Medicine

## 2016-10-05 ENCOUNTER — Encounter: Payer: Self-pay | Admitting: Family Medicine

## 2016-10-05 DIAGNOSIS — F901 Attention-deficit hyperactivity disorder, predominantly hyperactive type: Secondary | ICD-10-CM

## 2016-10-05 MED ORDER — METHYLPHENIDATE HCL 20 MG PO TABS: 20.0000 mg | ORAL_TABLET | Freq: Three times a day (TID) | ORAL | 0 refills | Status: DC

## 2016-10-05 NOTE — Telephone Encounter (Signed)
Pt needs written Rx for Ritalin, has insurance now so she needs separate scripts for 30 days each #90 each & call when ready

## 2016-10-05 NOTE — Telephone Encounter (Signed)
Left message Rx Ritalin x3 ready for pick up

## 2016-10-05 NOTE — Telephone Encounter (Signed)
She now has insurance coverage and will like Ritalin. Apparently the Ritalin 20 mg 3 times a day is working quite well for her as opposed her previous dosing of Concerta and Ritalin.

## 2016-10-13 ENCOUNTER — Telehealth: Payer: Self-pay | Admitting: Family Medicine

## 2016-10-13 NOTE — Telephone Encounter (Signed)
P.A. BRAND RITALIN

## 2016-10-15 NOTE — Telephone Encounter (Signed)
P.A. Approved til 10/12/19, pt informed

## 2017-02-01 ENCOUNTER — Telehealth: Payer: Self-pay

## 2017-02-01 ENCOUNTER — Telehealth: Payer: Self-pay | Admitting: Family Medicine

## 2017-02-01 DIAGNOSIS — F901 Attention-deficit hyperactivity disorder, predominantly hyperactive type: Secondary | ICD-10-CM

## 2017-02-01 MED ORDER — METHYLPHENIDATE HCL 20 MG PO TABS: 20.0000 mg | ORAL_TABLET | Freq: Three times a day (TID) | ORAL | 0 refills | Status: DC

## 2017-02-01 MED ORDER — ALUMINUM CHLORIDE 20 % EX SOLN: Freq: Every day | CUTANEOUS | 0 refills | Status: DC

## 2017-02-01 NOTE — Telephone Encounter (Signed)
PT aware Ritalin prescriptions ready for pick up. Alexandria Hanna

## 2017-02-01 NOTE — Telephone Encounter (Signed)
Pt requesting refill on Ritalin 20 mg and Hypercare for excessive sweating. Call pt at [phone] when script ready for pick up

## 2017-03-08 ENCOUNTER — Ambulatory Visit (INDEPENDENT_AMBULATORY_CARE_PROVIDER_SITE_OTHER): Payer: BLUE CROSS/BLUE SHIELD | Admitting: Family Medicine

## 2017-03-08 ENCOUNTER — Encounter: Payer: Self-pay | Admitting: Family Medicine

## 2017-03-08 VITALS — BP 110/70 | HR 75 | Temp 98.2°F | Resp 16 | Wt 146.2 lb

## 2017-03-08 DIAGNOSIS — R05 Cough: Secondary | ICD-10-CM | POA: Diagnosis not present

## 2017-03-08 DIAGNOSIS — J0140 Acute pansinusitis, unspecified: Secondary | ICD-10-CM

## 2017-03-08 DIAGNOSIS — R059 Cough, unspecified: Secondary | ICD-10-CM

## 2017-03-08 MED ORDER — AMOXICILLIN 875 MG PO TABS: 875.0000 mg | ORAL_TABLET | Freq: Two times a day (BID) | ORAL | 0 refills | Status: DC

## 2017-03-08 MED ORDER — BENZONATATE 200 MG PO CAPS: 200.0000 mg | ORAL_CAPSULE | Freq: Two times a day (BID) | ORAL | 0 refills | Status: DC | PRN

## 2017-03-08 NOTE — Progress Notes (Signed)
Chief Complaint  Patient presents with  . sick    sinuses, migraine, sinus pressure, stopped up, cough- tried otc- sudafed, zytrec, tried everything. been going on a couple weeks   Subjective:  Alexandria Hanna is a 41 y.o. female who presents for possible sinus infection.  Symptoms include a one week history of nasal congestion, sinus pressure, ear popping, frontal headache, body aches and 2 days ago she developed a productive cough. states symptoms are getting worse.  Denies fever, chills, rash, sore throat, chest pain, palpitations, shortness of breath, wheezing, abdominal pain, N/V/D.   Past history is significant for asthma and this is well controlled. Patient is a non-smoker.  Using Sudafed, Flonase, Zyrec, Mucinex, and multiple cold medications for symptoms.  Denies sick contacts.  No other aggravating or relieving factors.  No other c/o.  No recent antibiotic use.   ROS as in subjective   Objective: Vitals:   03/08/17 1133  BP: 110/70  Pulse: 75  Resp: 16  Temp: 98.2 F (36.8 C)  SpO2: 98%    General appearance: Alert, WD/WN, no distress                             Skin: warm, no rash                           Head: + frontal and maxillary sinus tenderness,                            Eyes: conjunctiva normal, corneas clear, PERRLA                            Ears: pearly TMs with some fluid bilaterally, external ear canals normal                          Nose: septum midline, turbinates swollen, with erythema and clear discharge             Mouth/throat: MMM, tongue normal, mild pharyngeal erythema                           Neck: supple, no adenopathy, no thyromegaly, nontender                          Heart: RRR, normal S1, S2, no murmurs                         Lungs: CTA bilaterally, no wheezes, rales, or rhonchi       Assessment and Plan: Acute non-recurrent pansinusitis - Plan: amoxicillin (AMOXIL) 875 MG tablet  Cough - Plan: benzonatate (TESSALON) 200 MG  capsule  Prescription sent for Amoxil and Tessalon. She will continue with allergy and asthma treatment as usual.   Can use OTC Mucinex for congestion.  Tylenol or Ibuprofen OTC for fever and malaise.  Discussed symptomatic relief, nasal saline flush, and call if not back to baseline after completing the antibiotic or sooner if she gets worse.

## 2017-04-13 ENCOUNTER — Ambulatory Visit: Payer: BLUE CROSS/BLUE SHIELD | Admitting: Family Medicine

## 2017-04-13 ENCOUNTER — Encounter: Payer: Self-pay | Admitting: Family Medicine

## 2017-04-13 VITALS — BP 130/70 | HR 85 | Resp 18 | Wt 145.0 lb

## 2017-04-13 DIAGNOSIS — Z630 Problems in relationship with spouse or partner: Secondary | ICD-10-CM

## 2017-04-13 DIAGNOSIS — Z209 Contact with and (suspected) exposure to unspecified communicable disease: Secondary | ICD-10-CM | POA: Diagnosis not present

## 2017-04-13 NOTE — Progress Notes (Signed)
   Subjective:    Patient ID: Alexandria Hanna, female    DOB: 11/11/1975, 41 y.o.   MRN: [id number]  HPI She is here for consult concerning difficulty with marital stress and also to be STD tested. Apparently her estranged husband has started drinking again as well as doing drugs and become sexually active. She has been in counseling concerning this. Most recently the counselor strongly advised her to get legal counsel concerning getting separation.   Review of Systems     Objective:   Physical Exam Alert and in no distress otherwise not examined       Assessment & Plan:  Marital stress  Contact with or exposure to communicable disease - Plan: RPR, HIV antibody, CANCELED: GC/chlamydia probe amp, urine  I discussed the present situation in detail with her. Strongly encouraged her to follow-up with her therapist as well as take his recommendation for getting involved with legal counsel. Encouraged her to write down bullet points for the lawyer as her ADD sometimes causes her to ramble. Also strongly encouraged her to follow his legal advice and be very careful to protect herself and her child.

## 2017-04-14 LAB — C. TRACHOMATIS/N. GONORRHOEAE RNA
C. trachomatis RNA, TMA: NOT DETECTED
N. GONORRHOEAE RNA, TMA: NOT DETECTED

## 2017-04-14 LAB — HIV ANTIBODY (ROUTINE TESTING W REFLEX): HIV: NONREACTIVE

## 2017-06-21 ENCOUNTER — Telehealth: Payer: Self-pay | Admitting: Family Medicine

## 2017-06-21 DIAGNOSIS — F901 Attention-deficit hyperactivity disorder, predominantly hyperactive type: Secondary | ICD-10-CM

## 2017-06-21 MED ORDER — RITALIN 20 MG PO TABS: 20.0000 mg | ORAL_TABLET | Freq: Three times a day (TID) | ORAL | 0 refills | Status: DC

## 2017-06-21 NOTE — Telephone Encounter (Signed)
Pt needs refill on BRAND Ritalin to [facility]. Pt will call me if has any problems with P.A. Was approved til 2021

## 2017-06-26 ENCOUNTER — Telehealth: Payer: Self-pay | Admitting: Family Medicine

## 2017-06-30 NOTE — Telephone Encounter (Signed)
P.A. Approved til 06/23/20, Left message for pt, faxed pharmacy

## 2017-06-30 NOTE — Telephone Encounter (Signed)
Called [employer] t# [phone] because haven't heard decision from P.A. States taking longer due to excessive amounts of P.A. Should have decision today, no other info needed

## 2017-08-10 IMAGING — MG MM SCREEN MAMMOGRAM BILATERAL
4 series · 4 of 4 positions shown · non-contrast
Comparison: Previous exam(s).

CLINICAL DATA: Screening.

EXAM:
DIGITAL SCREENING BILATERAL MAMMOGRAM WITH CAD

[R CC]
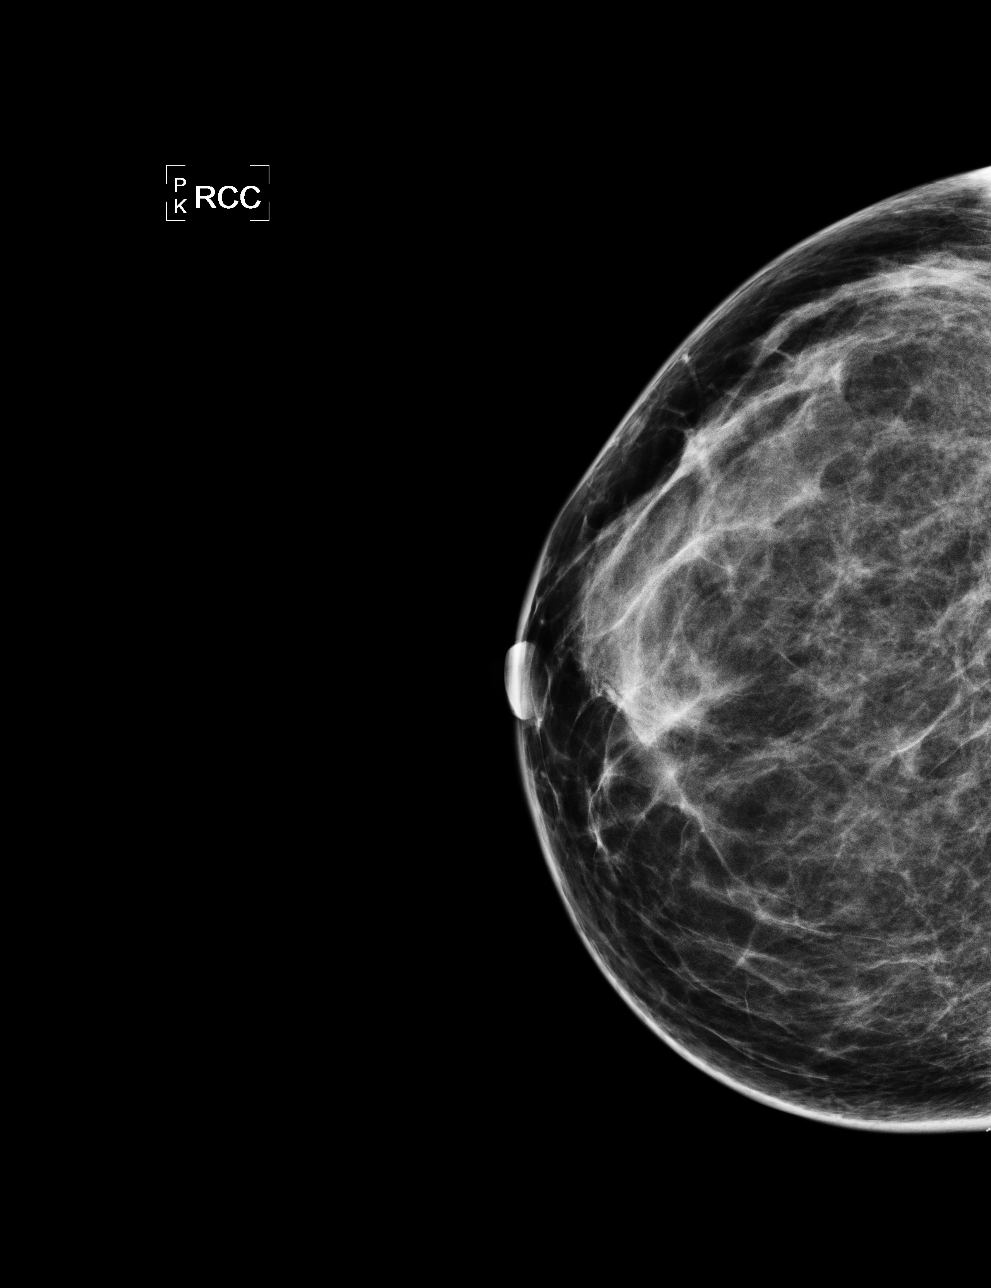

[L CC]
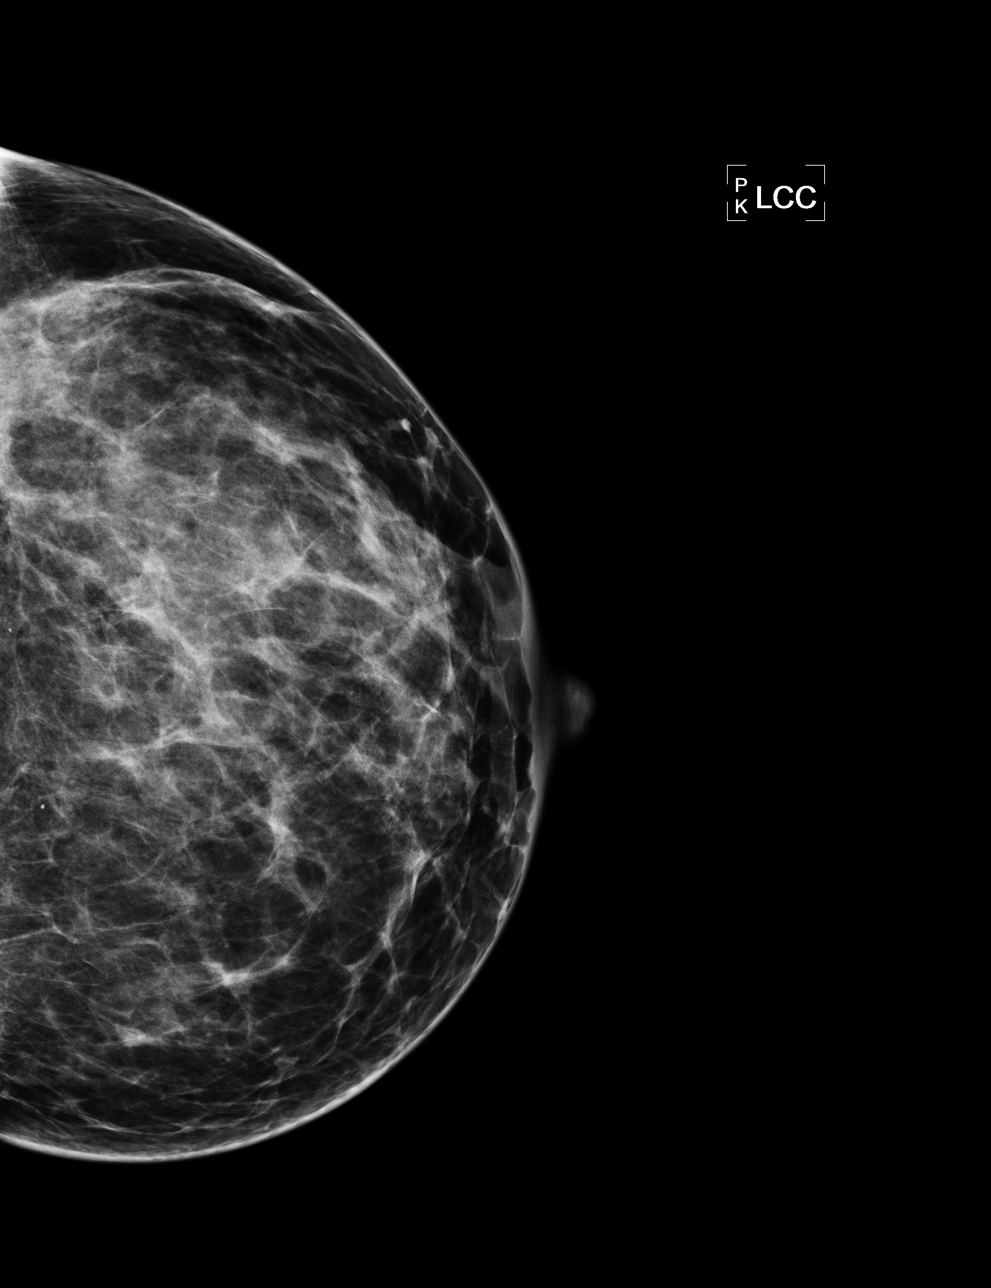

[L MLO]
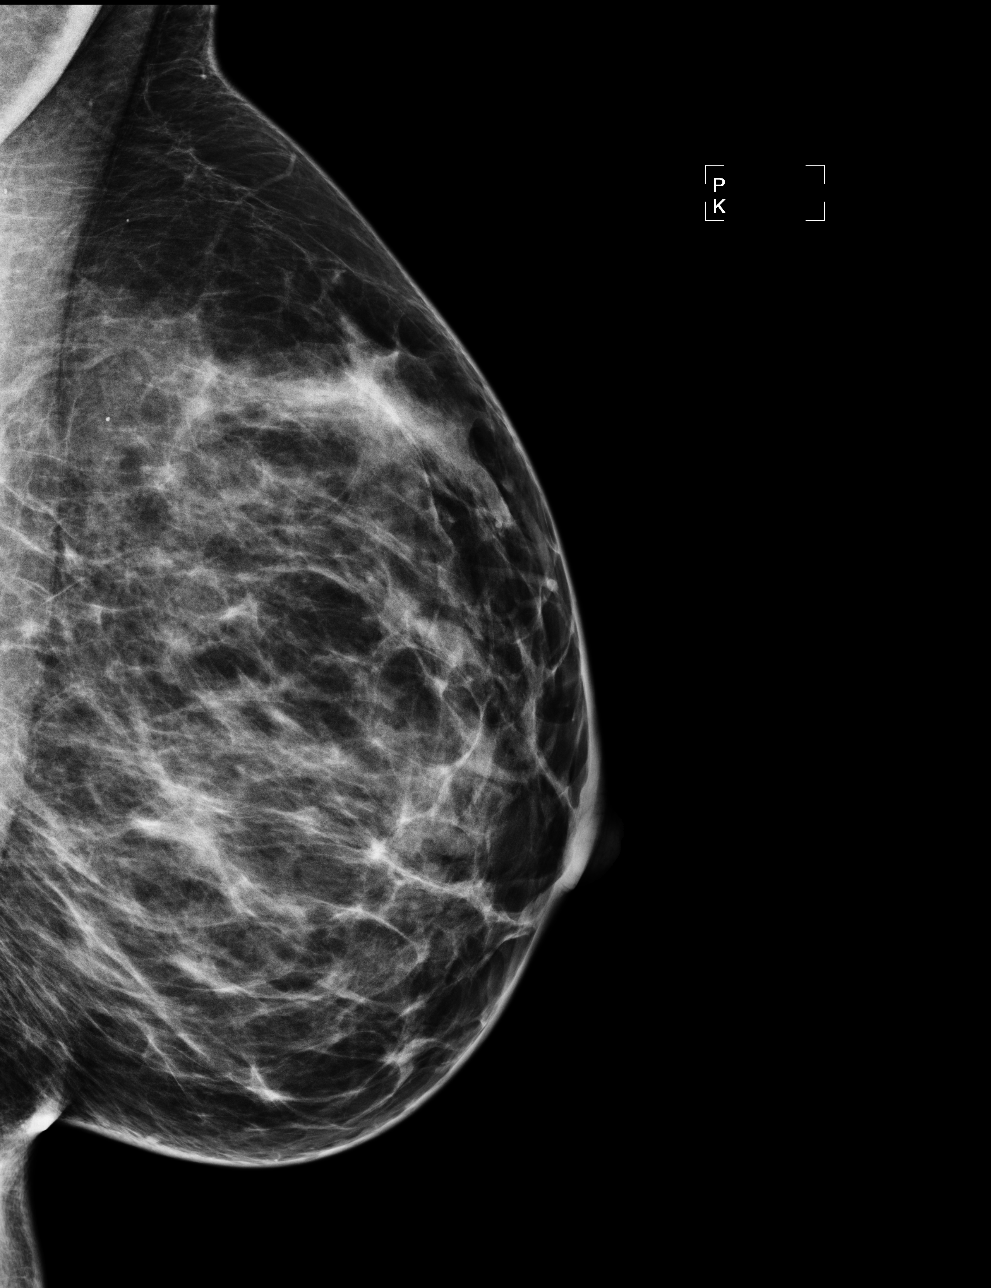

[R MLO]
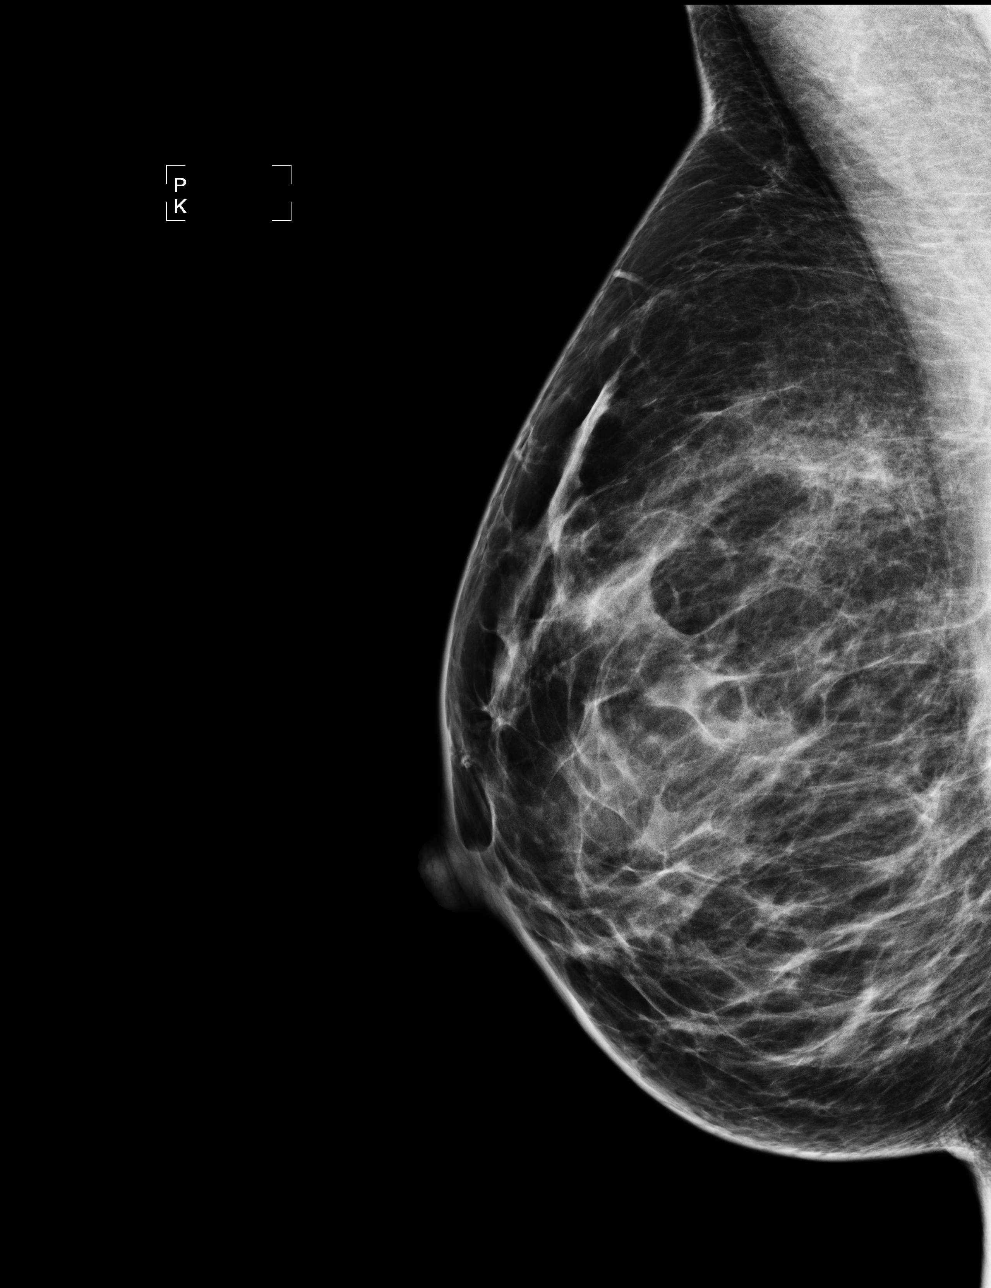

[4 of 4 positions shown; findings below may reference images not displayed]

ACR Breast Density Category b: There are scattered areas of
fibroglandular density.
FINDINGS: There are no findings suspicious for malignancy. Images were
processed with CAD.
IMPRESSION: No mammographic evidence of malignancy. A result letter of this
screening mammogram will be mailed directly to the patient.

RECOMMENDATION:
Screening mammogram in one year. (Code:AS-G-LCT)

BI-RADS CATEGORY  1: Negative.

## 2017-09-07 ENCOUNTER — Ambulatory Visit: Payer: BLUE CROSS/BLUE SHIELD | Admitting: Family Medicine

## 2017-09-07 ENCOUNTER — Encounter: Payer: Self-pay | Admitting: Family Medicine

## 2017-09-07 VITALS — BP 106/68 | HR 80 | Temp 98.5°F | Wt 147.0 lb

## 2017-09-07 DIAGNOSIS — Z8709 Personal history of other diseases of the respiratory system: Secondary | ICD-10-CM

## 2017-09-07 DIAGNOSIS — J0140 Acute pansinusitis, unspecified: Secondary | ICD-10-CM

## 2017-09-07 MED ORDER — AMOXICILLIN-POT CLAVULANATE 875-125 MG PO TABS: 1.0000 | ORAL_TABLET | Freq: Two times a day (BID) | ORAL | 0 refills | Status: DC

## 2017-09-07 NOTE — Progress Notes (Signed)
   Subjective:    Patient ID: Alexandria Hanna, female    DOB: 09-21-1975, 42 y.o.   MRN: [id number]  HPI She complains of a one-month history of rhinorrhea, sinus congestion, ear fullness, upper tooth discomfort with sore throat, dry cough and PND with some malaise myalgias especially in the last several days.  She has been using OTC medications for that.  She also has a history of asthma but has not had any difficulty with that recently.   Review of Systems     Objective:   Physical Exam Alert and in no distress.  Nasal mucosa is normal.  Tender over maxillary sinuses.  Tympanic membranes and canals are normal. Pharyngeal area is normal. Neck is supple without adenopathy or thyromegaly. Cardiac exam shows a regular sinus rhythm without murmurs or gallops. Lungs are clear to auscultation.        Assessment & Plan:  Acute non-recurrent pansinusitis - Plan: amoxicillin-clavulanate (AUGMENTIN) 875-125 MG tablet  History of asthma She is to call if not entirely better when she finishes the antibiotic.

## 2017-09-07 NOTE — Patient Instructions (Signed)
Get back on your allergy medications including Zyrtec and Flonase take all the antibiotic and if not totally back to normal you finish the antibiotic call me.

## 2017-11-08 ENCOUNTER — Telehealth: Payer: Self-pay | Admitting: Family Medicine

## 2017-11-08 DIAGNOSIS — F901 Attention-deficit hyperactivity disorder, predominantly hyperactive type: Secondary | ICD-10-CM

## 2017-11-08 MED ORDER — RITALIN 20 MG PO TABS: 20.0000 mg | ORAL_TABLET | Freq: Three times a day (TID) | ORAL | 0 refills | Status: DC

## 2017-11-08 NOTE — Telephone Encounter (Signed)
Pt requesting refill on Ritalin 20 mg to [facility] at [facility]

## 2018-04-07 ENCOUNTER — Telehealth: Payer: Self-pay | Admitting: Family Medicine

## 2018-04-07 DIAGNOSIS — F901 Attention-deficit hyperactivity disorder, predominantly hyperactive type: Secondary | ICD-10-CM

## 2018-04-07 MED ORDER — RITALIN 20 MG PO TABS: 20.0000 mg | ORAL_TABLET | Freq: Three times a day (TID) | ORAL | 0 refills | Status: DC

## 2018-04-07 NOTE — Telephone Encounter (Signed)
Pt called for refills of Ritalin. Please send to [facility] on [address]. Pt can be reached at [phone].

## 2018-08-03 ENCOUNTER — Telehealth: Payer: Self-pay | Admitting: Family Medicine

## 2018-08-03 DIAGNOSIS — F901 Attention-deficit hyperactivity disorder, predominantly hyperactive type: Secondary | ICD-10-CM

## 2018-08-03 MED ORDER — RITALIN 20 MG PO TABS: 20.0000 mg | ORAL_TABLET | Freq: Three times a day (TID) | ORAL | 0 refills | Status: DC

## 2018-08-03 NOTE — Telephone Encounter (Signed)
She needs an office visit  Med check

## 2018-08-03 NOTE — Telephone Encounter (Signed)
Pt needs refill on Ritalin. She uses [facility] on [address]

## 2018-08-04 ENCOUNTER — Telehealth: Payer: Self-pay | Admitting: Family Medicine

## 2018-08-04 NOTE — Telephone Encounter (Signed)
Got pt scheduled for next week. She called again and wanted to know if she could get at least a 30 day supply of Ritalin until then because she is completely out.

## 2018-08-04 NOTE — Telephone Encounter (Signed)
I called it in

## 2018-08-08 ENCOUNTER — Encounter: Payer: BLUE CROSS/BLUE SHIELD | Admitting: Family Medicine

## 2018-08-08 NOTE — Telephone Encounter (Signed)
Pt advised pharmacy was out and will have it on hand today 08-08-18. [place]

## 2018-08-08 NOTE — Telephone Encounter (Signed)
Pt has appt 08-11-18. [place]

## 2018-08-11 ENCOUNTER — Ambulatory Visit: Payer: No Typology Code available for payment source | Admitting: Family Medicine

## 2018-08-11 ENCOUNTER — Encounter: Payer: Self-pay | Admitting: Family Medicine

## 2018-08-11 VITALS — BP 106/68 | HR 63 | Temp 98.4°F | Wt 145.2 lb

## 2018-08-11 DIAGNOSIS — J0140 Acute pansinusitis, unspecified: Secondary | ICD-10-CM

## 2018-08-11 DIAGNOSIS — J209 Acute bronchitis, unspecified: Secondary | ICD-10-CM | POA: Diagnosis not present

## 2018-08-11 DIAGNOSIS — F901 Attention-deficit hyperactivity disorder, predominantly hyperactive type: Secondary | ICD-10-CM | POA: Diagnosis not present

## 2018-08-11 DIAGNOSIS — Z0000 Encounter for general adult medical examination without abnormal findings: Secondary | ICD-10-CM

## 2018-08-11 MED ORDER — RITALIN 20 MG PO TABS: 20.0000 mg | ORAL_TABLET | Freq: Three times a day (TID) | ORAL | 0 refills | Status: DC

## 2018-08-11 NOTE — Progress Notes (Signed)
   Subjective:    Patient ID: Alexandria Hanna, female    DOB: [birth year], 43 y.o.   MRN: [id number]  HPI She is here for complete examination.  She did see her gynecologist within the last month and apparently she is doing well.  She is using Implanon.  She is now divorced and is quite happy over this.  They are still working on custody issues with the son.  She also has a 10-day history of nasal and ear congestion, sore throat, cough and watery eyes but no fever, chills, shortness of breath.  She continues to do well on her Ritalin and would like to continue on this particular dosing regimen as she does use the medicine more on an as-needed basis.  She has no other concerns or complaints.  Family and social history as well as health maintenance immunizations was reviewed   Review of Systems  All other systems reviewed and are negative.      Objective:   Physical Exam Alert and in no distress. Tympanic membranes and canals are normal. Pharyngeal area is normal. Neck is supple without adenopathy or thyromegaly. Cardiac exam shows a regular sinus rhythm without murmurs or gallops. Lungs are clear to auscultation.  She did refuse a flu shot     Assessment & Plan:  Routine general medical examination at a health care facility - Plan: CBC with Differential/Platelet, Comprehensive metabolic panel, Lipid panel  Attention-deficit hyperactivity disorder, predominantly hyperactive type - Plan: RITALIN 20 MG tablet, RITALIN 20 MG tablet Encouraged her to continue to take good care of herself.  She will be seen as needed.

## 2018-08-12 LAB — CBC WITH DIFFERENTIAL/PLATELET
BASOS: 1 %
Basophils Absolute: 0 10*3/uL (ref 0.0–0.2)
EOS (ABSOLUTE): 0.1 10*3/uL (ref 0.0–0.4)
EOS: 1 %
HEMATOCRIT: 38 % (ref 34.0–46.6)
HEMOGLOBIN: 13.5 g/dL (ref 11.1–15.9)
IMMATURE GRANULOCYTES: 0 %
Immature Grans (Abs): 0 10*3/uL (ref 0.0–0.1)
Lymphocytes Absolute: 1.2 10*3/uL (ref 0.7–3.1)
Lymphs: 29 %
MCH: 33.4 pg — ABNORMAL HIGH (ref 26.6–33.0)
MCHC: 35.5 g/dL (ref 31.5–35.7)
MCV: 94 fL (ref 79–97)
MONOCYTES: 8 %
MONOS ABS: 0.3 10*3/uL (ref 0.1–0.9)
NEUTROS PCT: 61 %
Neutrophils Absolute: 2.5 10*3/uL (ref 1.4–7.0)
Platelets: 225 10*3/uL (ref 150–450)
RBC: 4.04 x10E6/uL (ref 3.77–5.28)
RDW: 11.6 % — ABNORMAL LOW (ref 11.7–15.4)
WBC: 4.1 10*3/uL (ref 3.4–10.8)

## 2018-08-12 LAB — LIPID PANEL
CHOL/HDL RATIO: 2.5 ratio (ref 0.0–4.4)
CHOLESTEROL TOTAL: 189 mg/dL (ref 100–199)
HDL: 76 mg/dL (ref >39)
LDL CALC: 102 mg/dL — AB (ref 0–99)
TRIGLYCERIDES: 57 mg/dL (ref 0–149)
VLDL CHOLESTEROL CAL: 11 mg/dL (ref 5–40)

## 2018-08-12 LAB — COMPREHENSIVE METABOLIC PANEL
ALBUMIN: 4.5 g/dL (ref 3.8–4.8)
ALK PHOS: 63 IU/L (ref 39–117)
ALT: 8 IU/L (ref 0–32)
AST: 15 IU/L (ref 0–40)
Albumin/Globulin Ratio: 2.5 — ABNORMAL HIGH (ref 1.2–2.2)
BUN / CREAT RATIO: 10 (ref 9–23)
BUN: 8 mg/dL (ref 6–24)
Bilirubin Total: 0.5 mg/dL (ref 0.0–1.2)
CO2: 21 mmol/L (ref 20–29)
CREATININE: 0.79 mg/dL (ref 0.57–1.00)
Calcium: 9.2 mg/dL (ref 8.7–10.2)
Chloride: 105 mmol/L (ref 96–106)
GFR calc Af Amer: 105 mL/min/{1.73_m2} (ref >59)
GFR calc non Af Amer: 91 mL/min/{1.73_m2} (ref >59)
GLOBULIN, TOTAL: 1.8 g/dL (ref 1.5–4.5)
Glucose: 74 mg/dL (ref 65–99)
Potassium: 4.6 mmol/L (ref 3.5–5.2)
SODIUM: 143 mmol/L (ref 134–144)
Total Protein: 6.3 g/dL (ref 6.0–8.5)

## 2018-08-12 MED ORDER — AMOXICILLIN-POT CLAVULANATE 875-125 MG PO TABS: 1.0000 | ORAL_TABLET | Freq: Two times a day (BID) | ORAL | 0 refills | Status: DC

## 2018-08-12 NOTE — Addendum Note (Signed)
Addended by: Denita Lung on: 08/12/2018 04:08 PM   Modules accepted: Orders

## 2018-08-22 ENCOUNTER — Telehealth: Payer: Self-pay | Admitting: Family Medicine

## 2018-08-22 DIAGNOSIS — Z8709 Personal history of other diseases of the respiratory system: Secondary | ICD-10-CM

## 2018-08-22 MED ORDER — ALBUTEROL SULFATE HFA 108 (90 BASE) MCG/ACT IN AERS: 2.0000 | INHALATION_SPRAY | Freq: Four times a day (QID) | RESPIRATORY_TRACT | 0 refills | Status: DC | PRN

## 2018-08-22 NOTE — Telephone Encounter (Signed)
   Needs refill on her inhaler ( Proventil) sent To [facility]

## 2018-09-21 ENCOUNTER — Telehealth: Payer: Self-pay | Admitting: Family Medicine

## 2018-09-21 DIAGNOSIS — F901 Attention-deficit hyperactivity disorder, predominantly hyperactive type: Secondary | ICD-10-CM

## 2018-09-21 NOTE — Telephone Encounter (Signed)
Pt called concerning Ritalin refills. She states that she does not have a refill available for March. She states she had Feb and one is on file at pharmacy for 04/26 and 05/26 but she needs one for now. Please send to [facility] on [address].

## 2018-09-21 NOTE — Telephone Encounter (Signed)
Pt called concerning her refills of

## 2018-09-22 MED ORDER — RITALIN 20 MG PO TABS: 20.0000 mg | ORAL_TABLET | Freq: Three times a day (TID) | ORAL | 0 refills | Status: DC

## 2018-09-22 NOTE — Telephone Encounter (Signed)
Let her know that a gave her a 10-day supply which will get her to when her next regular prescription can be filled

## 2018-09-22 NOTE — Telephone Encounter (Signed)
Pt was advised [facility]

## 2019-01-23 ENCOUNTER — Telehealth: Payer: Self-pay | Admitting: Family Medicine

## 2019-01-23 DIAGNOSIS — F901 Attention-deficit hyperactivity disorder, predominantly hyperactive type: Secondary | ICD-10-CM

## 2019-01-23 MED ORDER — RITALIN 20 MG PO TABS: 20.0000 mg | ORAL_TABLET | Freq: Three times a day (TID) | ORAL | 0 refills | Status: DC

## 2019-01-23 NOTE — Telephone Encounter (Signed)
Pt needs refill on Ritalin sent to the [facility] on [address]

## 2019-07-11 ENCOUNTER — Telehealth: Payer: Self-pay | Admitting: Family Medicine

## 2019-07-11 DIAGNOSIS — F901 Attention-deficit hyperactivity disorder, predominantly hyperactive type: Secondary | ICD-10-CM

## 2019-07-11 MED ORDER — RITALIN 20 MG PO TABS: 20.0000 mg | ORAL_TABLET | Freq: Three times a day (TID) | ORAL | 0 refills | Status: DC

## 2019-07-11 NOTE — Telephone Encounter (Signed)
Pt called and is requesting a refill on her ritalin 20 mg please send to the [facility], [place] AT [address]

## 2020-01-08 ENCOUNTER — Telehealth: Payer: Self-pay | Admitting: Family Medicine

## 2020-01-08 DIAGNOSIS — F901 Attention-deficit hyperactivity disorder, predominantly hyperactive type: Secondary | ICD-10-CM

## 2020-01-08 MED ORDER — RITALIN 20 MG PO TABS: 20.0000 mg | ORAL_TABLET | Freq: Three times a day (TID) | ORAL | 0 refills | Status: DC

## 2020-01-08 NOTE — Telephone Encounter (Signed)
Pt called and made an appt for next week. She will need refills before then. Please send Ritalin to [facility] on [address]. Pt can be reached [phone]

## 2020-01-18 ENCOUNTER — Encounter: Payer: Self-pay | Admitting: Family Medicine

## 2020-01-18 ENCOUNTER — Ambulatory Visit: Payer: No Typology Code available for payment source | Admitting: Family Medicine

## 2020-01-18 ENCOUNTER — Other Ambulatory Visit: Payer: Self-pay

## 2020-01-18 VITALS — BP 102/68 | HR 64 | Temp 98.7°F | Wt 149.6 lb

## 2020-01-18 DIAGNOSIS — Z1211 Encounter for screening for malignant neoplasm of colon: Secondary | ICD-10-CM | POA: Diagnosis not present

## 2020-01-18 DIAGNOSIS — F901 Attention-deficit hyperactivity disorder, predominantly hyperactive type: Secondary | ICD-10-CM

## 2020-01-18 MED ORDER — RITALIN 20 MG PO TABS: 20.0000 mg | ORAL_TABLET | Freq: Three times a day (TID) | ORAL | 0 refills | Status: DC

## 2020-01-18 NOTE — Progress Notes (Signed)
   Subjective:    Patient ID: Alexandria Hanna, female    DOB: 10-Sep-1975, 44 y.o.   MRN: [id number]  HPI She is here for an interval evaluation.  She does see a gynecologist regularly.  She has been having some shoulder difficulty and has seen orthopedics for this.  She continues to do quite nicely on her Ritalin.  She takes it 3 times per day and does get good benefit maintaining focus during the entire timeframe and wants to continue on it.  She has no side effects from the medication.  Review of record indicates that she has not had colon cancer screening there is no family history of colon cancer.   Review of Systems     Objective:   Physical Exam Alert and in no distress otherwise not examined       Assessment & Plan:  Attention-deficit hyperactivity disorder, predominantly hyperactive type - Plan: RITALIN 20 MG tablet, RITALIN 20 MG tablet  Screening for colon cancer - Plan: Cologuard I discussed colon cancer screening and options.  She chose to have Cologuard.

## 2020-02-21 LAB — HM COLONOSCOPY

## 2020-02-23 LAB — COLOGUARD

## 2020-02-28 LAB — EXTERNAL GENERIC LAB PROCEDURE: COLOGUARD: NEGATIVE

## 2020-02-28 LAB — COLOGUARD: COLOGUARD: NEGATIVE

## 2020-03-01 NOTE — Progress Notes (Signed)
LVm for pt to call back. Her cologuard was negative . [place]

## 2020-03-05 ENCOUNTER — Encounter: Payer: Self-pay | Admitting: Family Medicine

## 2020-06-07 ENCOUNTER — Telehealth: Payer: Self-pay

## 2020-06-07 NOTE — Telephone Encounter (Signed)
P.A. BRAND RITALIN

## 2020-06-29 NOTE — Telephone Encounter (Signed)
P.A. was denied states not covered benefit.  Resubmitted in new year to see if that has coverage now

## 2020-07-02 NOTE — Telephone Encounter (Signed)
New P.A. also denied, appeal typed and faxed.  Pt informed

## 2020-07-08 NOTE — Telephone Encounter (Signed)
Called pt and informed of denial and she using Good Rx card and paying out of pocket

## 2020-07-18 ENCOUNTER — Telehealth: Payer: Self-pay | Admitting: Family Medicine

## 2020-07-18 DIAGNOSIS — F901 Attention-deficit hyperactivity disorder, predominantly hyperactive type: Secondary | ICD-10-CM

## 2020-07-18 MED ORDER — RITALIN 20 MG PO TABS: 20.0000 mg | ORAL_TABLET | Freq: Three times a day (TID) | ORAL | 0 refills | Status: DC

## 2020-07-18 NOTE — Telephone Encounter (Signed)
Pt needs refill on Ritalin sent to the [facility] on [address]

## 2020-07-26 NOTE — Telephone Encounter (Signed)
Appeal was also denied.  States plan pt purchased does not cover medications to treat mental disorders, this is excluded from coverage

## 2020-08-12 ENCOUNTER — Encounter: Payer: Self-pay | Admitting: Family Medicine

## 2020-08-12 ENCOUNTER — Other Ambulatory Visit: Payer: Self-pay

## 2020-08-12 ENCOUNTER — Telehealth (INDEPENDENT_AMBULATORY_CARE_PROVIDER_SITE_OTHER): Payer: No Typology Code available for payment source | Admitting: Family Medicine

## 2020-08-12 VITALS — Ht 68.0 in | Wt 145.0 lb

## 2020-08-12 DIAGNOSIS — U071 COVID-19: Secondary | ICD-10-CM

## 2020-08-12 DIAGNOSIS — Z8709 Personal history of other diseases of the respiratory system: Secondary | ICD-10-CM

## 2020-08-12 DIAGNOSIS — J3489 Other specified disorders of nose and nasal sinuses: Secondary | ICD-10-CM

## 2020-08-12 MED ORDER — AZITHROMYCIN 250 MG PO TABS: ORAL_TABLET | ORAL | 0 refills | Status: DC

## 2020-08-12 MED ORDER — ALBUTEROL SULFATE HFA 108 (90 BASE) MCG/ACT IN AERS: 2.0000 | INHALATION_SPRAY | Freq: Four times a day (QID) | RESPIRATORY_TRACT | 0 refills | Status: DC | PRN

## 2020-08-12 NOTE — Progress Notes (Signed)
Start time: 3:30 End time: 3:59  Virtual Visit via Video Note  I connected with Ardean Larsen on 08/12/20 by a video enabled telemedicine application and verified that I am speaking with the correct person using two identifiers.  Location: Patient: in her car, alone Provider: office   I discussed the limitations of evaluation and management by telemedicine and the availability of in person appointments. The patient expressed understanding and agreed to proceed.  History of Present Illness:  Chief Complaint  Patient presents with  . Covid Positive    VIRTUAL postive covid. Tested positive Tues last week, at home test. Still having dry cough. Doesn't have an inhaler and she feels like she needs one. Not sure if she needs a zpak-has discolored mucus. Taking mucinex for the last day or so and has been coughing up now. No fever. 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    Symptoms startd 2/24, 2/25--felt tired. She worked on Monday 2/28.  Tuesday 3/1 the body aches got worse, she took a COVID test, which was positive. She stayed home Wed/Thurs, slept all day.  Was feeling better on Friday, but then over the weekend she felt worse again.  Cough started Wednesday of last week. Fri/Sat she started Mucinex (every 4 hours, liquid), and now she is able to get up phlegm.  It was dark brown/yellow this morning.  No known fevers (never felt warm, didn't check temps).  Nasal drainage is clear.  She has had some teeth sensitivity. Phlegm is discolored, only started today. She has Fall/Winter allergies. She is taking Claritin daily. She also takes Flonase, using it daily since she got sick.  She has h/o asthma, improved as she got older.  Only flares when she gets sick. Albuterol inhaler she has is old, needs a refill. She feels some chest tightness, in mid chest.  Denies pain with breathing. No shortness of breath, but hasn't been very active in the last week.   PMH, PSH,  SH reviewed   Outpatient Encounter Medications as of 08/12/2020  Medication Sig  . etonogestrel (NEXPLANON) 68 MG IMPL implant Nexplanon 68 mg subdermal implant  Inject 1 implant by subcutaneous route.  . fluticasone (FLONASE) 50 MCG/ACT nasal spray Place 2 sprays into both nostrils daily.  Marland Kitchen guaiFENesin (MUCINEX PO) Take 1 tablet by mouth every 4 (four) hours.  Marland Kitchen loratadine (CLARITIN) 10 MG tablet Take 10 mg by mouth daily.  Derrill Memo ON 09/15/2020] RITALIN 20 MG tablet Take 1 tablet (20 mg total) by mouth 3 (three) times daily.  Derrill Memo ON 08/15/2020] RITALIN 20 MG tablet Take 1 tablet (20 mg total) by mouth 3 (three) times daily.  Marland Kitchen RITALIN 20 MG tablet Take 1 tablet (20 mg total) by mouth 3 (three) times daily.  Marland Kitchen albuterol (PROVENTIL HFA;VENTOLIN HFA) 108 (90 Base) MCG/ACT inhaler Inhale 2 puffs into the lungs every 6 (six) hours as needed for wheezing or shortness of breath. (Patient not taking: Reported on 08/12/2020)  . [DISCONTINUED] amoxicillin-clavulanate (AUGMENTIN) 875-125 MG tablet Take 1 tablet by mouth 2 (two) times daily. (Patient not taking: Reported on 01/18/2020)   No facility-administered encounter medications on file as of 08/12/2020.    Allergies  Allergen Reactions  . Polysporin [Bacitracin-Polymyxin B] Itching and Rash  . Lactose Intolerance (Gi)    ROS: no fever, chills, chest pain. +cough, sinus pain, fatigue and myalgias per HPI. No N/V/D, bleeding/bruising, rash or other complaints.    Observations/Objective:  Ht 5'  8" (1.727 m)   Wt 145 lb (65.8 kg)   BMI 22.05 kg/m   Pleasant, well-appearing female, in no distress Rare cough. HEENT: conjunctiva is clear, EOMI. She is alert, oriented. Normal speech. Normal mood, affect, grooming. Exam is limited due to virtual nature of the visit.   Assessment and Plan:  COVID-19 virus infection - supportive measures reviewed.  Some component or RAD.  Proper isolation reviewed - Plan: albuterol (VENTOLIN HFA) 108 (90  Base) MCG/ACT inhaler  History of asthma - Plan: albuterol (VENTOLIN HFA) 108 (90 Base) MCG/ACT inhaler  Sinus pain - discussed sinuses fullness/pain vs infection. Encouraged sinus rinse, mucinex. If sinus pain persists, start ABX - Plan: azithromycin (ZITHROMAX) 250 MG tablet  All questions were answered. Will print and mail AVS, as pt isn't on MyChart.  She jotted notes down with recommendations during visit.    Follow Up Instructions:    I discussed the assessment and treatment plan with the patient. The patient was provided an opportunity to ask questions and all were answered. The patient agreed with the plan and demonstrated an understanding of the instructions.   The patient was advised to call back or seek an in-person evaluation if the symptoms worsen or if the condition fails to improve as anticipated.  I spent 31 minutes dedicated to the care of this patient, including pre-visit review of records, face to face time, post-visit ordering of testing and documentation.    Vikki Ports, MD

## 2020-08-12 NOTE — Patient Instructions (Signed)
Take Mucinex DM 12 hour twice daily Use any decongestants (ie sudafed) with caution along with your Ritalin (best not to use both together), if needed for sinus pain. Use sinus rinses twice daily (neti-pot or sinus rinse kit). Use inhaler as needed  Start the antibiotic if symptoms aren't improving (sinus pain, any discolored phlegm) in the next 1-2 days. Once you start the zpak, it is important to complete the course.  Contact us in 5 days if no better at all, or in 10 days if you aren't completely better. We will change the antibiotic if no better after 5 days on the zpak. If you are improved, but not completely better by day 10, we will send in a refill.  10 Things You Can Do to Manage Your COVID-19 Symptoms at Home If you have possible or confirmed COVID-19: 1. Stay home except to get medical care. 2. Monitor your symptoms carefully. If your symptoms get worse, call your healthcare provider immediately. 3. Get rest and stay hydrated. 4. If you have a medical appointment, call the healthcare provider ahead of time and tell them that you have or may have COVID-19. 5. For medical emergencies, call 911 and notify the dispatch personnel that you have or may have COVID-19. 6. Cover your cough and sneezes with a tissue or use the inside of your elbow. 7. Wash your hands often with soap and water for at least 20 seconds or clean your hands with an alcohol-based hand sanitizer that contains at least 60% alcohol. 8. As much as possible, stay in a specific room and away from other people in your home. Also, you should use a separate bathroom, if available. If you need to be around other people in or outside of the home, wear a mask. 9. Avoid sharing personal items with other people in your household, like dishes, towels, and bedding. 10. Clean all surfaces that are touched often, like counters, tabletops, and doorknobs. Use household cleaning sprays or wipes according to the label  instructions. [email or web address] 12/22/2019 This information is not intended to replace advice given to you by your health care provider. Make sure you discuss any questions you have with your health care provider. Document Revised: 04/08/2020 Document Reviewed: 04/08/2020 Elsevier Patient Education  2021 [employer].

## 2020-08-13 NOTE — Progress Notes (Signed)
done

## 2020-09-09 ENCOUNTER — Other Ambulatory Visit: Payer: Self-pay | Admitting: Family Medicine

## 2020-09-09 DIAGNOSIS — U071 COVID-19: Secondary | ICD-10-CM

## 2020-09-09 DIAGNOSIS — Z8709 Personal history of other diseases of the respiratory system: Secondary | ICD-10-CM

## 2020-09-09 NOTE — Telephone Encounter (Signed)
Called patient. This is not an auto refill. She was given this for covid last month-still having some issues when she exercises (plays tennis) so she is asking for a refill.

## 2021-01-21 ENCOUNTER — Telehealth: Payer: Self-pay | Admitting: Family Medicine

## 2021-01-21 MED ORDER — RITALIN 20 MG PO TABS: 20.0000 mg | ORAL_TABLET | Freq: Three times a day (TID) | ORAL | 0 refills | Status: DC

## 2021-01-21 NOTE — Telephone Encounter (Signed)
Pt called and is requesting a refill on her ritalan please send to the [place], [address] AT [place]

## 2021-01-21 NOTE — Telephone Encounter (Signed)
Pt will call back to schedule. She was at open house with her son. [place]

## 2021-04-09 ENCOUNTER — Ambulatory Visit: Payer: No Typology Code available for payment source | Admitting: Family Medicine

## 2021-04-09 ENCOUNTER — Other Ambulatory Visit: Payer: Self-pay

## 2021-04-09 ENCOUNTER — Encounter: Payer: Self-pay | Admitting: Family Medicine

## 2021-04-09 VITALS — BP 118/61 | HR 86 | Ht 68.0 in | Wt 150.8 lb

## 2021-04-09 DIAGNOSIS — Z2821 Immunization not carried out because of patient refusal: Secondary | ICD-10-CM

## 2021-04-09 DIAGNOSIS — R61 Generalized hyperhidrosis: Secondary | ICD-10-CM

## 2021-04-09 DIAGNOSIS — Z8709 Personal history of other diseases of the respiratory system: Secondary | ICD-10-CM

## 2021-04-09 DIAGNOSIS — J301 Allergic rhinitis due to pollen: Secondary | ICD-10-CM

## 2021-04-09 DIAGNOSIS — Z8616 Personal history of COVID-19: Secondary | ICD-10-CM

## 2021-04-09 DIAGNOSIS — F901 Attention-deficit hyperactivity disorder, predominantly hyperactive type: Secondary | ICD-10-CM | POA: Diagnosis not present

## 2021-04-09 MED ORDER — RITALIN 20 MG PO TABS: 20.0000 mg | ORAL_TABLET | Freq: Three times a day (TID) | ORAL | 0 refills | Status: DC

## 2021-04-09 NOTE — Progress Notes (Signed)
   Subjective:    Patient ID: Alexandria Hanna, female    DOB: 15-Jun-1975, 45 y.o.   MRN: [id number]  HPI She is here for an interval evaluation.  She does have underlying ADHD and is doing quite nicely on her present medication regimen.  1 pill usually lasts for 3 hours.  She usually takes some 2-3 times per day.  She has tried the long-acting variety but found it to not be as useful.  She is not interested in the flu shot or COVID.  Her allergies seem to be under good control with Allegra and Nasacort.  She does have underlying asthma and does intermittently use albuterol.  She recently saw her gynecologist.  Her work and home life are going well.  Her son is now 5 years old.   Review of Systems     Objective:   Physical Exam  Alert and in no distress.  Cardiac exam shows regular rhythm without murmurs gallops.  Lungs clear to auscultation.      Assessment & Plan:  Attention-deficit hyperactivity disorder, predominantly hyperactive type - Plan: RITALIN 20 MG tablet, RITALIN 20 MG tablet, RITALIN 20 MG tablet  History of asthma  Hyperhidrosis  Immunization refused  Seasonal allergic rhinitis due to pollen  History of COVID-19 I discussed vaccines with her but at this point she is not interested in pursuing them.

## 2021-07-14 ENCOUNTER — Telehealth: Payer: No Typology Code available for payment source | Admitting: Family Medicine

## 2021-07-14 ENCOUNTER — Encounter: Payer: Self-pay | Admitting: Family Medicine

## 2021-07-14 VITALS — Temp 99.2°F | Wt 145.0 lb

## 2021-07-14 DIAGNOSIS — J0191 Acute recurrent sinusitis, unspecified: Secondary | ICD-10-CM

## 2021-07-14 DIAGNOSIS — J301 Allergic rhinitis due to pollen: Secondary | ICD-10-CM

## 2021-07-14 MED ORDER — AMOXICILLIN-POT CLAVULANATE 875-125 MG PO TABS: 1.0000 | ORAL_TABLET | Freq: Two times a day (BID) | ORAL | 0 refills | Status: DC

## 2021-07-14 NOTE — Progress Notes (Signed)
° °  Subjective:    Patient ID: Alexandria Hanna, female    DOB: 01-13-1976, 46 y.o.   MRN: [id number]  HPI Documentation for virtual audio and video telecommunications through [place] encounter: The patient was located at home. 2 patient identifiers used.  The provider was located in the office. The patient did consent to this visit and is aware of possible charges through their insurance for this visit. The other persons participating in this telemedicine service were none. Time spent on call was 5 minutes and in review of previous records >20 minutes total for counseling and coordination of care. This virtual service is not related to other E/M service within previous 7 days.  She has a 3-week history of difficulty with pressure around the eyes, left ear congestion, rhinorrhea, PND with malaise.  The drainage is purulent in nature.  No fever, chills, sore throat or true earache.  She does have underlying allergies and does use Nasacort, Allegra and [employer].  Review of Systems     Objective:   Physical Exam Alert and in no distress otherwise not examined       Assessment & Plan:  Acute recurrent sinusitis, unspecified location - Plan: amoxicillin-clavulanate (AUGMENTIN) 875-125 MG tablet  Seasonal allergic rhinitis due to pollen Encouraged her to take all the antibiotic and if not better when she finishes, call me.  She is also to continue on her allergy medications.

## 2021-07-22 ENCOUNTER — Telehealth: Payer: Self-pay | Admitting: Family Medicine

## 2021-07-22 NOTE — Telephone Encounter (Signed)
Pt called and states that she can not find any pharmacy that has 20 mg of Ritalin. She has called several. Please advise pt on what she can do. Maybe 2 10 mg instead of 20 mg. Pt can be reached at [phone]. Pt states the last time she got it filled they could only do 1/2 supply. Pt uses [facility] on [address].

## 2021-07-22 NOTE — Telephone Encounter (Signed)
Pt was advised [facility]

## 2021-07-23 ENCOUNTER — Telehealth: Payer: Self-pay | Admitting: Family Medicine

## 2021-07-23 DIAGNOSIS — F901 Attention-deficit hyperactivity disorder, predominantly hyperactive type: Secondary | ICD-10-CM

## 2021-07-23 MED ORDER — METHYLPHENIDATE HCL 10 MG PO TABS: ORAL_TABLET | ORAL | 0 refills | Status: DC

## 2021-07-23 NOTE — Telephone Encounter (Signed)
Pt called and states that [facility], [address] AT [place] Has the 2m of ritalin if you would please send that in for her

## 2021-08-18 ENCOUNTER — Telehealth: Payer: Self-pay | Admitting: Family Medicine

## 2021-08-18 DIAGNOSIS — F901 Attention-deficit hyperactivity disorder, predominantly hyperactive type: Secondary | ICD-10-CM

## 2021-08-18 MED ORDER — METHYLPHENIDATE HCL 20 MG PO TABS: 20.0000 mg | ORAL_TABLET | Freq: Three times a day (TID) | ORAL | 0 refills | Status: DC

## 2021-08-18 NOTE — Telephone Encounter (Signed)
Pt  called Ritalin rx ? ?She is still having trouble finding brand name Ritalin and now she is almost out of meds., at this point she is willing to try generic again and see how it does. She has not tried generic in a long time so maybe they are different now  ? ?Can you send in rx for generic to [facility] ? ?She is going to continue to try to locate brand name ?

## 2021-11-12 ENCOUNTER — Telehealth: Payer: Self-pay | Admitting: Family Medicine

## 2021-11-12 DIAGNOSIS — F901 Attention-deficit hyperactivity disorder, predominantly hyperactive type: Secondary | ICD-10-CM

## 2021-11-12 MED ORDER — RITALIN 20 MG PO TABS: 20.0000 mg | ORAL_TABLET | Freq: Three times a day (TID) | ORAL | 0 refills | Status: DC

## 2021-11-12 MED ORDER — METHYLPHENIDATE HCL 20 MG PO TABS: 20.0000 mg | ORAL_TABLET | Freq: Three times a day (TID) | ORAL | 0 refills | Status: DC

## 2021-11-12 NOTE — Telephone Encounter (Signed)
Kareemah called and asked for a refill on her ritalin.

## 2022-03-12 ENCOUNTER — Telehealth: Payer: Self-pay | Admitting: Family Medicine

## 2022-03-12 DIAGNOSIS — F901 Attention-deficit hyperactivity disorder, predominantly hyperactive type: Secondary | ICD-10-CM

## 2022-03-12 MED ORDER — METHYLPHENIDATE HCL 20 MG PO TABS: 20.0000 mg | ORAL_TABLET | Freq: Three times a day (TID) | ORAL | 0 refills | Status: DC

## 2022-03-12 NOTE — Telephone Encounter (Signed)
Pt called and is requesting a refill on her ritalin 20 mg please send to the [id number] - [place], [place] - [address] AT [address] 

## 2022-03-13 NOTE — Telephone Encounter (Signed)
Done KH 

## 2022-03-16 ENCOUNTER — Telehealth: Payer: Self-pay

## 2022-03-16 DIAGNOSIS — F901 Attention-deficit hyperactivity disorder, predominantly hyperactive type: Secondary | ICD-10-CM

## 2022-03-16 MED ORDER — RITALIN 20 MG PO TABS: 20.0000 mg | ORAL_TABLET | Freq: Three times a day (TID) | ORAL | 0 refills | Status: DC

## 2022-03-16 NOTE — Telephone Encounter (Signed)
Pt. Called stating you sent in her Ritalin on 03/12/22 and it was sent in as a generic. She stats she can only take the name brand Ritalin not the generic if you could change that for her.

## 2022-03-19 ENCOUNTER — Ambulatory Visit: Payer: No Typology Code available for payment source | Admitting: Family Medicine

## 2022-03-19 ENCOUNTER — Encounter: Payer: Self-pay | Admitting: Family Medicine

## 2022-03-19 VITALS — BP 110/70 | HR 68 | Temp 97.1°F | Wt 140.8 lb

## 2022-03-19 DIAGNOSIS — Z8709 Personal history of other diseases of the respiratory system: Secondary | ICD-10-CM | POA: Diagnosis not present

## 2022-03-19 DIAGNOSIS — Z79899 Other long term (current) drug therapy: Secondary | ICD-10-CM

## 2022-03-19 DIAGNOSIS — J301 Allergic rhinitis due to pollen: Secondary | ICD-10-CM | POA: Diagnosis not present

## 2022-03-19 DIAGNOSIS — F901 Attention-deficit hyperactivity disorder, predominantly hyperactive type: Secondary | ICD-10-CM

## 2022-03-19 DIAGNOSIS — Z13220 Encounter for screening for lipoid disorders: Secondary | ICD-10-CM

## 2022-03-19 DIAGNOSIS — Z1159 Encounter for screening for other viral diseases: Secondary | ICD-10-CM

## 2022-03-19 NOTE — Progress Notes (Signed)
   Subjective:    Patient ID: Alexandria Hanna, female    DOB: March 28, 1976, 46 y.o.   MRN: [id number]  HPI She is here for a recheck.  She does have underlying ADHD and is now taking 20 mg 3 times per day of Adderall.  Each pill lasts roughly 4 hours. She does need the d.a.w. as generic products do not work well for her. She states that this has been a Higher education careers adviser for her in her life allowing her to be focused and accomplish tasks during the whole day.  Her allergies seem to be under good control.  She rarely uses the Ventolin inhaler.  She does see her gynecologist regularly for care.  She has a 73-year-old and is starting to date.  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  She has no other concerns or complaints.   Review of Systems     Objective:   Physical Exam Alert and in no distress. Tympanic membranes and canals are normal. Pharyngeal area is normal. Neck is supple without adenopathy or thyromegaly. Cardiac exam shows a regular sinus rhythm without murmurs or gallops. Lungs are clear to auscultation.        Assessment & Plan:  Attention-deficit hyperactivity disorder, predominantly hyperactive type  Seasonal allergic rhinitis due to pollen - Plan: CBC with Differential/Platelet, Comprehensive metabolic panel  History of asthma - Plan: CBC with Differential/Platelet, Comprehensive metabolic panel  Attention deficit hyperactivity disorder (ADHD), predominantly hyperactive type  Encounter for long-term (current) use of medications - Plan: CBC with Differential/Platelet, Comprehensive metabolic panel, Lipid panel  Need for hepatitis C screening test - Plan: Hepatitis C antibody  Screening for lipid disorders - Plan: Lipid panel She is doing well on the present Adderall dosing.  Continue to treat allergies as needed.  Follow-up with gynecology on a regular basis and I will see her again in 6 to 12 months.

## 2022-03-20 LAB — CBC WITH DIFFERENTIAL/PLATELET
Basophils Absolute: 0 10*3/uL (ref 0.0–0.2)
Basos: 1 %
EOS (ABSOLUTE): 0 10*3/uL (ref 0.0–0.4)
Eos: 1 %
Hematocrit: 38.9 % (ref 34.0–46.6)
Hemoglobin: 13.5 g/dL (ref 11.1–15.9)
Immature Grans (Abs): 0 10*3/uL (ref 0.0–0.1)
Immature Granulocytes: 0 %
Lymphocytes Absolute: 1.4 10*3/uL (ref 0.7–3.1)
Lymphs: 23 %
MCH: 33.4 pg — ABNORMAL HIGH (ref 26.6–33.0)
MCHC: 34.7 g/dL (ref 31.5–35.7)
MCV: 96 fL (ref 79–97)
Monocytes Absolute: 0.4 10*3/uL (ref 0.1–0.9)
Monocytes: 6 %
Neutrophils Absolute: 4.2 10*3/uL (ref 1.4–7.0)
Neutrophils: 69 %
Platelets: 196 10*3/uL (ref 150–450)
RBC: 4.04 x10E6/uL (ref 3.77–5.28)
RDW: 12 % (ref 11.7–15.4)
WBC: 6.1 10*3/uL (ref 3.4–10.8)

## 2022-03-20 LAB — COMPREHENSIVE METABOLIC PANEL
ALT: 12 IU/L (ref 0–32)
AST: 14 IU/L (ref 0–40)
Albumin/Globulin Ratio: 2.4 — ABNORMAL HIGH (ref 1.2–2.2)
Albumin: 4.5 g/dL (ref 3.9–4.9)
Alkaline Phosphatase: 65 IU/L (ref 44–121)
BUN/Creatinine Ratio: 15 (ref 9–23)
BUN: 11 mg/dL (ref 6–24)
Bilirubin Total: 0.6 mg/dL (ref 0.0–1.2)
CO2: 22 mmol/L (ref 20–29)
Calcium: 9.5 mg/dL (ref 8.7–10.2)
Chloride: 99 mmol/L (ref 96–106)
Creatinine, Ser: 0.74 mg/dL (ref 0.57–1.00)
Globulin, Total: 1.9 g/dL (ref 1.5–4.5)
Glucose: 85 mg/dL (ref 70–99)
Potassium: 4.1 mmol/L (ref 3.5–5.2)
Sodium: 136 mmol/L (ref 134–144)
Total Protein: 6.4 g/dL (ref 6.0–8.5)
eGFR: 100 mL/min/{1.73_m2} (ref >59)

## 2022-03-20 LAB — HEPATITIS C ANTIBODY: Hep C Virus Ab: NONREACTIVE

## 2022-03-20 LAB — LIPID PANEL
Chol/HDL Ratio: 2 ratio (ref 0.0–4.4)
Cholesterol, Total: 171 mg/dL (ref 100–199)
HDL: 86 mg/dL (ref >39)
LDL Chol Calc (NIH): 71 mg/dL (ref 0–99)
Triglycerides: 73 mg/dL (ref 0–149)
VLDL Cholesterol Cal: 14 mg/dL (ref 5–40)

## 2022-05-06 ENCOUNTER — Telehealth: Payer: Self-pay | Admitting: Family Medicine

## 2022-05-06 DIAGNOSIS — F901 Attention-deficit hyperactivity disorder, predominantly hyperactive type: Secondary | ICD-10-CM

## 2022-05-06 MED ORDER — RITALIN 20 MG PO TABS: 20.0000 mg | ORAL_TABLET | Freq: Three times a day (TID) | ORAL | 0 refills | Status: DC

## 2022-05-06 NOTE — Telephone Encounter (Signed)
Pt called and stated her brother is having a quadruple bypass and heart issues run in her family and the provider is wanting her to get a CT Calcium screening but she needs a referral.

## 2022-05-06 NOTE — Telephone Encounter (Signed)
Pt requesting refill on her ritalin to  [id number] - [place], [place] - [address] AT [facility] OF [address]

## 2022-05-07 NOTE — Telephone Encounter (Signed)
Called pt and explained your response but she does not want to make an appointment, she says she feels fine she just wants to get the screening done because the heart doctor wanted her brother's immediate family to have one. She asks if you can give her a call.

## 2022-06-17 ENCOUNTER — Encounter: Payer: Self-pay | Admitting: Family Medicine

## 2022-06-17 ENCOUNTER — Ambulatory Visit: Payer: No Typology Code available for payment source | Admitting: Family Medicine

## 2022-06-17 VITALS — BP 118/76 | HR 80 | Resp 14 | Wt 143.6 lb

## 2022-06-17 DIAGNOSIS — F40243 Fear of flying: Secondary | ICD-10-CM

## 2022-06-17 DIAGNOSIS — Z8249 Family history of ischemic heart disease and other diseases of the circulatory system: Secondary | ICD-10-CM

## 2022-06-17 MED ORDER — ALPRAZOLAM 0.25 MG PO TABS: 0.2500 mg | ORAL_TABLET | Freq: Two times a day (BID) | ORAL | 0 refills | Status: DC | PRN

## 2022-06-17 NOTE — Progress Notes (Signed)
   Subjective:    Patient ID: Alexandria Hanna, female    DOB: 03-29-1976, 47 y.o.   MRN: [id number]  HPI She is here for consult concerning risk for heart disease.  She notes that her 42 year old brother recently had CABG graft.  Apparently her father has also had some heart related issues.  She does not smoke, has had no blood pressure issues.  Did have a recent lipid panel done which showed an LDL of 71.  She has had no cardiac related symptoms. Also of note is the fact that she is now engaged and is doing a lot of flying.  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   Review of Systems     Objective:   Physical Exam Alert and in no distress.  Cardiac exam shows regular rhythm without murmurs or gallops.  Lungs are clear to auscultation. EKG read by me shows a rate of 72 with a normal sinus rhythm and all other parameters normal.      Assessment & Plan:  Family history of heart disease in brother - Plan: CT CARDIAC SCORING, EKG 12-Lead  Fear of flying - Plan: ALPRAZolam (XANAX) 0.25 MG tablet I will give him Xanax to help with flying.  Cautioned him to not drink alcohol while using the Xanax. Discussed the risk of heart disease with her and we will order a coronary calcium score and then decide at that point whether further intervention would be needed.  She was comfortable with that.

## 2022-07-10 ENCOUNTER — Ambulatory Visit (HOSPITAL_BASED_OUTPATIENT_CLINIC_OR_DEPARTMENT_OTHER)
Admission: RE | Admit: 2022-07-10 | Discharge: 2022-07-10 | Disposition: A | Discharge status: Home / Self Care | Payer: 59 | Source: Ambulatory Visit | Attending: Family Medicine | Admitting: Family Medicine

## 2022-07-10 DIAGNOSIS — Z8249 Family history of ischemic heart disease and other diseases of the circulatory system: Secondary | ICD-10-CM | POA: Insufficient documentation

## 2022-07-16 ENCOUNTER — Telehealth: Payer: Self-pay | Admitting: Family Medicine

## 2022-07-16 ENCOUNTER — Other Ambulatory Visit (HOSPITAL_COMMUNITY): Payer: Self-pay

## 2022-07-16 DIAGNOSIS — F901 Attention-deficit hyperactivity disorder, predominantly hyperactive type: Secondary | ICD-10-CM

## 2022-07-16 MED ORDER — RITALIN 20 MG PO TABS
20.0000 mg | ORAL_TABLET | Freq: Three times a day (TID) | ORAL | 0 refills | Status: DC
  Filled 2022-07-16 – 2022-07-17 (×2): qty 90, 30d supply, fill #0

## 2022-07-16 MED ORDER — RITALIN 20 MG PO TABS: 20.0000 mg | ORAL_TABLET | Freq: Three times a day (TID) | ORAL | 0 refills | Status: DC

## 2022-07-16 NOTE — Addendum Note (Signed)
Addended by: Denita Lung on: 07/16/2022 04:51 PM   Modules accepted: Orders

## 2022-07-16 NOTE — Telephone Encounter (Signed)
Pt called and they are out of stock of her 20mg  ritalin   Pt wants the name brand ritalin 10mg  they have some in stock at the pharmacy, Pt said please send this in asap, before they sell them they can not hold them for her,  Please send to the [facility], [address] AT [place]

## 2022-07-17 ENCOUNTER — Other Ambulatory Visit (HOSPITAL_COMMUNITY): Payer: Self-pay

## 2022-07-20 ENCOUNTER — Other Ambulatory Visit (HOSPITAL_COMMUNITY): Payer: Self-pay

## 2022-07-21 ENCOUNTER — Other Ambulatory Visit (HOSPITAL_COMMUNITY): Payer: Self-pay

## 2022-07-31 ENCOUNTER — Other Ambulatory Visit: Payer: Self-pay | Admitting: Medical

## 2022-07-31 ENCOUNTER — Telehealth: Payer: Self-pay | Admitting: Family Medicine

## 2022-07-31 ENCOUNTER — Other Ambulatory Visit (HOSPITAL_COMMUNITY): Payer: Self-pay

## 2022-07-31 DIAGNOSIS — F901 Attention-deficit hyperactivity disorder, predominantly hyperactive type: Secondary | ICD-10-CM

## 2022-07-31 MED ORDER — RITALIN 20 MG PO TABS: 20.0000 mg | ORAL_TABLET | Freq: Three times a day (TID) | ORAL | 0 refills | Status: DC

## 2022-07-31 MED ORDER — RITALIN 20 MG PO TABS
20.0000 mg | ORAL_TABLET | Freq: Three times a day (TID) | ORAL | 0 refills | Status: DC
  Filled 2022-07-31 – 2022-08-17 (×2): qty 90, 30d supply, fill #0

## 2022-07-31 NOTE — Telephone Encounter (Signed)
Pt called and cannot find ritalin 20 mg anywhere, she was able to find 10 mg at [facility], [address] AT [place] , she's asking if this can be called in for her soon so that it also doesn't get taken before she can get it.

## 2022-08-04 ENCOUNTER — Other Ambulatory Visit (HOSPITAL_COMMUNITY): Payer: Self-pay

## 2022-08-04 ENCOUNTER — Telehealth: Payer: Self-pay | Admitting: Family Medicine

## 2022-08-04 MED ORDER — METHYLPHENIDATE HCL 10 MG PO TABS: ORAL_TABLET | ORAL | 0 refills | Status: DC

## 2022-08-04 NOTE — Telephone Encounter (Signed)
Alexandria Hanna called and expressed that Alexandria Hanna does not have any ritalin and is on back order but she has found 10 mg at  [facility], [address] AT [place] and she is ok with 10 mg, she says she really needs the prescription, she hasn't had it for the last month and its bothering her.

## 2022-08-10 ENCOUNTER — Other Ambulatory Visit (HOSPITAL_COMMUNITY): Payer: Self-pay

## 2022-08-17 ENCOUNTER — Other Ambulatory Visit (HOSPITAL_COMMUNITY): Payer: Self-pay

## 2022-09-22 ENCOUNTER — Telehealth: Payer: Self-pay

## 2022-09-22 DIAGNOSIS — F901 Attention-deficit hyperactivity disorder, predominantly hyperactive type: Secondary | ICD-10-CM

## 2022-09-22 MED ORDER — RITALIN 20 MG PO TABS: 20.0000 mg | ORAL_TABLET | Freq: Three times a day (TID) | ORAL | 0 refills | Status: DC

## 2022-09-22 NOTE — Telephone Encounter (Signed)
Pt called and advised she is needing a refill of the brand name Ritalin .

## 2022-10-28 ENCOUNTER — Encounter: Payer: Self-pay | Admitting: Family Medicine

## 2022-10-28 ENCOUNTER — Ambulatory Visit: Payer: No Typology Code available for payment source | Admitting: Family Medicine

## 2022-10-28 VITALS — BP 120/60 | HR 60 | Temp 98.1°F | Ht 68.0 in | Wt 144.0 lb

## 2022-10-28 DIAGNOSIS — N3000 Acute cystitis without hematuria: Secondary | ICD-10-CM

## 2022-10-28 DIAGNOSIS — R3915 Urgency of urination: Secondary | ICD-10-CM | POA: Diagnosis not present

## 2022-10-28 DIAGNOSIS — N949 Unspecified condition associated with female genital organs and menstrual cycle: Secondary | ICD-10-CM

## 2022-10-28 LAB — POCT URINALYSIS DIP (PROADVANTAGE DEVICE)
Bilirubin, UA: NEGATIVE
Glucose, UA: NEGATIVE mg/dL
Ketones, POC UA: NEGATIVE mg/dL
Nitrite, UA: NEGATIVE
Protein Ur, POC: NEGATIVE mg/dL
Specific Gravity, Urine: 1.01
Urobilinogen, Ur: 0.2
pH, UA: 7 (ref 5.0–8.0)

## 2022-10-28 MED ORDER — NITROFURANTOIN MONOHYD MACRO 100 MG PO CAPS: 100.0000 mg | ORAL_CAPSULE | Freq: Two times a day (BID) | ORAL | 0 refills | Status: DC

## 2022-10-28 NOTE — Progress Notes (Signed)
Chief Complaint  Patient presents with   Urinary Urgency    Possible yeast, did monistat last night and took a diflucan yesterday. Having LBP and urinary urgency.    Patient presents with concern for UTI. She has been having a lot of external burning, thought related to playing a lot of tennis, sweating. She is very active, also swims a lot. She gives a h/o yeast infections, which were all external (no discharge). She recalls a bad infection last summer.  External burning started on 5/17. She has some itching, but no vaginal discharge. She used monistat last night once (internally), a little externally yesterday morning Took diflucan yesterday morning  She currently reports slight lower back discomfort. Denies suprapubic/abdominal pain.  She had a UTI last summer--urine was cloudy. Recalls having urinary frequency, passing large volumes, never had dysuria.  Feels similar. She is going out of town for a couple of weeks, will be in [place] through 6/8. Very strong feeling is a UTI and wants treatment  Implanon removed in January, no period since. Partner has had vasectomy. No bleeding/spotting.   PMH, PSH, SH reviewed  Outpatient Encounter Medications as of 10/28/2022  Medication Sig Note   fluconazole (DIFLUCAN) 150 MG tablet Take 150 mg by mouth daily. 10/28/2022: Took yesterday   fluticasone (FLONASE) 50 MCG/ACT nasal spray Place 2 sprays into both nostrils daily.    miconazole (MONISTAT 7) 2 % vaginal cream Place 1 Applicatorful vaginally at bedtime. 10/28/2022: Used some yesterday   RITALIN 20 MG tablet Take 1 tablet (20 mg total) by mouth 3 (three) times daily.    albuterol (VENTOLIN HFA) 108 (90 Base) MCG/ACT inhaler INHALE 2 PUFFS INTO THE LUNGS EVERY 6 HOURS AS NEEDED FOR WHEEZING OR SHORTNESS OF BREATH (Patient not taking: Reported on 07/14/2021) 10/28/2022: prn   ALPRAZolam (XANAX) 0.25 MG tablet Take 1 tablet (0.25 mg total) by mouth 2 (two) times daily as needed for anxiety.  (Patient not taking: Reported on 10/28/2022) 10/28/2022: prn   valACYclovir (VALTREX) 500 MG tablet valacyclovir 500 mg tablet  TAKE 1 TABLET BY MOUTH TWICE DAILY FOR 5 DAYS (Patient not taking: Reported on 07/14/2021) 10/28/2022: prn   [DISCONTINUED] etonogestrel (NEXPLANON) 68 MG IMPL implant Nexplanon 68 mg subdermal implant  Inject 1 implant by subcutaneous route.    [DISCONTINUED] guaiFENesin (MUCINEX PO) Take 1 tablet by mouth every 4 (four) hours. (Patient not taking: Reported on 04/09/2021)    [DISCONTINUED] loratadine (CLARITIN) 10 MG tablet Take 10 mg by mouth daily. (Patient not taking: Reported on 03/19/2022)    [DISCONTINUED] selenium sulfide (SELSUN) 2.5 % shampoo selenium sulfide 2.5 % lotion  APPLY 1 APPLICATION TO THE AFFECTED AREA ON THE SKIN FROM NECK TO WAIST DAILY FOR 15 TO 20 MINUTES AND THEN WASH OFF FOR 7 DAYS (Patient not taking: Reported on 07/14/2021)    No facility-administered encounter medications on file as of 10/28/2022.   Allergies  Allergen Reactions   Polysporin [Bacitracin-Polymyxin B] Itching and Rash   Lactose Intolerance (Gi)    ROS:   No f/c, n/v. Has had some loose stool x 1-2 weeks. No bleeding/bruising, rashes. No HA, dizziness, CP, SOB No vaginal discharge. Urinary and GU complaints per HPI   PHYSICAL EXAM:  BP 120/60   Pulse 60   Temp 98.1 F (36.7 C) (Oral)   Ht 5\' 8"  (1.727 m)   Wt 144 lb (65.3 kg)   BMI 21.90 kg/m   Wt Readings from Last 3 Encounters:  10/28/22 144 lb (65.3 kg)  06/17/22 143 lb 9.6 oz (65.1 kg)  03/19/22 140 lb 12.8 oz (63.9 kg)   Pleasant female.  Very rushed (arrived late, and late to be somewhere else) She is in no distress HEENT: conjunctiva and sclera are clear, EOMI Heart: regular rate and rhythm Lungs: clear bilaterally Back: no CVA tenderness (area of discomfort is across lower back, nontender) Abdomen: nontender, no organomegaly or mass. GU:  there is a linear area of irritation/abrasion on the left,  posteriorly, just outside of the labia majora (likely an area irritated by the lining of her tennis skirt).  Elsewhere all appears normal--no skin irritation in other areas, no redness, no discharge. Extremities: no edema Neuro: alert and oriented, cranial nerves grossly intact, normal gait Psych: somewhat rushed, anxious, but otherwise normal mood, affect, hygiene and grooming   Urine: 2+ leuks, small blood.  SG 1.010   ASSESSMENT/PLAN:  Acute cystitis without hematuria - sx consistent with her prior infection, though exam/sx not typical.  Sent for culture. Treat presumptively with macrobid; changed per culture, if needed - Plan: nitrofurantoin, macrocrystal-monohydrate, (MACROBID) 100 MG capsule, Urine Culture  Urinary urgency - Plan: POCT Urinalysis DIP (Proadvantage Device)  Pain of female genitalia - burning externally; no e/o yeast on exam. Area of skin irritation. h/o external yeast. Discussed importance of keeping skin dry  Discussed cotton underwear (currently not wearing underwear with tennis skirts and built-in liner), and/or powder to help keep dry. Discussed changing out of sweaty/wet clothing/bathing suits. Discussed one area of irritation, not likely yeast.  Keep covered to avoid further irritation, consider Desitin to help prevent urine from irritating area. Patient has already treated self for yeast, no evidence on exam.  To avoid further monistat use at this time.  Patient arrived late for appointment, and was very rushed to be somewhere else.  Urine sent for culture. She leaves Saturday for the beach. If ABX needs to be changed after Saturday, pharmacy will be: [facility] [address] [id number]

## 2022-10-28 NOTE — Patient Instructions (Signed)
Please be sure to keep your skin dry.  Use powder, or cotton underwear. Do not stay in wet underwear. I do not see any evidence of yeast (though you have been treated adequately for it).  We are sending your urine for culture. We sent in antibiotics to your pharmacy. We will let you know if these need to be stopped or changed, based on the culture results.

## 2022-10-30 ENCOUNTER — Telehealth: Payer: Self-pay

## 2022-10-30 DIAGNOSIS — F901 Attention-deficit hyperactivity disorder, predominantly hyperactive type: Secondary | ICD-10-CM

## 2022-10-30 MED ORDER — RITALIN 20 MG PO TABS: 20.0000 mg | ORAL_TABLET | Freq: Three times a day (TID) | ORAL | 0 refills | Status: DC

## 2022-10-30 NOTE — Telephone Encounter (Signed)
Pt. Called needs refill on her Ritalin BRAND NAME ONLY last apt 06/17/22.

## 2022-10-30 NOTE — Telephone Encounter (Signed)
sent 

## 2022-11-01 LAB — URINE CULTURE

## 2023-02-01 ENCOUNTER — Telehealth: Payer: Self-pay | Admitting: Family Medicine

## 2023-02-01 DIAGNOSIS — F901 Attention-deficit hyperactivity disorder, predominantly hyperactive type: Secondary | ICD-10-CM

## 2023-02-01 MED ORDER — RITALIN 20 MG PO TABS
20.0000 mg | ORAL_TABLET | Freq: Three times a day (TID) | ORAL | 0 refills | Status: DC
Start: 2023-02-01 — End: 2023-05-02

## 2023-02-01 NOTE — Telephone Encounter (Signed)
Needs Ritalin rx  Brand name only  Sent to [facility]

## 2023-02-16 ENCOUNTER — Encounter: Payer: Self-pay | Admitting: Family Medicine

## 2023-02-16 ENCOUNTER — Telehealth: Payer: No Typology Code available for payment source | Admitting: Family Medicine

## 2023-02-16 VITALS — Ht 68.0 in | Wt 145.0 lb

## 2023-02-16 DIAGNOSIS — J301 Allergic rhinitis due to pollen: Secondary | ICD-10-CM | POA: Diagnosis not present

## 2023-02-16 DIAGNOSIS — J0100 Acute maxillary sinusitis, unspecified: Secondary | ICD-10-CM

## 2023-02-16 MED ORDER — AMOXICILLIN-POT CLAVULANATE 875-125 MG PO TABS
1.0000 | ORAL_TABLET | Freq: Two times a day (BID) | ORAL | 0 refills | Status: DC
Start: 2023-02-16 — End: 2023-05-03

## 2023-02-16 NOTE — Progress Notes (Signed)
   Subjective:    Patient ID: Alexandria Hanna, female    DOB: Apr 14, 1976, 47 y.o.   MRN: [id number]  HPI Documentation for virtual audio and video telecommunications through [place] encounter:  The patient was located at home. 2 patient identifiers used.  The provider was located in the office. The patient did consent to this visit and is aware of possible charges through their insurance for this visit. The other persons participating in this telemedicine service were none. Time spent on call was 5 minutes and in review of previous records >15 minutes total for counseling and coordination of care. This virtual service is not related to other E/M service within previous 7 days.  She complains of a 1 month history of popping in her ears but in the last week she has noted more allergy symptoms with nasal congestion, facial pressure, postnasal drainage, fatigue but no sore throat, earache or upper tooth discomfort.  She does occasionally take an antihistamine to help with her allergy symptoms.  Review of Systems     Objective:    Physical Exam Alert and in no distress.  She does state that she is tender over her maxillary sinuses.       Assessment & Plan:   Problem List Items Addressed This Visit     Seasonal allergic rhinitis due to pollen   Other Visit Diagnoses     Acute non-recurrent maxillary sinusitis    -  Primary   Relevant Medications   amoxicillin-clavulanate (AUGMENTIN) 875-125 MG tablet     She is to call me if not entirely better when she finishes the antibiotic.

## 2023-04-20 LAB — EXTERNAL GENERIC LAB PROCEDURE: COLOGUARD: NEGATIVE

## 2023-04-20 LAB — COLOGUARD: COLOGUARD: NEGATIVE

## 2023-05-02 DIAGNOSIS — Z0000 Encounter for general adult medical examination without abnormal findings: Secondary | ICD-10-CM | POA: Insufficient documentation

## 2023-05-03 ENCOUNTER — Ambulatory Visit (INDEPENDENT_AMBULATORY_CARE_PROVIDER_SITE_OTHER): Payer: No Typology Code available for payment source | Admitting: Family Medicine

## 2023-05-03 VITALS — BP 110/60 | HR 74 | Ht 67.5 in | Wt 148.2 lb

## 2023-05-03 DIAGNOSIS — F901 Attention-deficit hyperactivity disorder, predominantly hyperactive type: Secondary | ICD-10-CM | POA: Diagnosis not present

## 2023-05-03 DIAGNOSIS — R61 Generalized hyperhidrosis: Secondary | ICD-10-CM

## 2023-05-03 DIAGNOSIS — Z0000 Encounter for general adult medical examination without abnormal findings: Secondary | ICD-10-CM

## 2023-05-03 DIAGNOSIS — B001 Herpesviral vesicular dermatitis: Secondary | ICD-10-CM

## 2023-05-03 DIAGNOSIS — F40243 Fear of flying: Secondary | ICD-10-CM

## 2023-05-03 DIAGNOSIS — J301 Allergic rhinitis due to pollen: Secondary | ICD-10-CM

## 2023-05-03 MED ORDER — MONTELUKAST SODIUM 10 MG PO TABS: 10.0000 mg | ORAL_TABLET | Freq: Every day | ORAL | 3 refills | Status: AC

## 2023-05-03 MED ORDER — ALPRAZOLAM 0.25 MG PO TABS
0.2500 mg | ORAL_TABLET | Freq: Two times a day (BID) | ORAL | 0 refills | Status: AC | PRN
Start: 2023-05-03 — End: ?

## 2023-05-03 MED ORDER — RITALIN 20 MG PO TABS
20.0000 mg | ORAL_TABLET | Freq: Three times a day (TID) | ORAL | 0 refills | Status: DC
Start: 2023-05-03 — End: 2023-07-09

## 2023-05-03 NOTE — Progress Notes (Signed)
Complete physical exam  Patient: Alexandria Hanna   DOB: 27-Dec-1975   47 y.o. Female  MRN: [id number]  Subjective:    Chief Complaint  Patient presents with  . Annual Exam    Annual exam, no concerns. Would like refill on xanax.     Alexandria Hanna is a 47 y.o. female who presents today for a complete physical exam.  She reports consuming a  healthy  diet.  Works out 5 days a week.  She generally feels well. She reports sleeping poorly, wakes during the night.  She sees her gynecologist regularly.  She does not think she has her allergies under good control but is using Allegra as well as Nasacort and a nasal saline spray.  She has had a recent Cologuard given to her by her gynecologist which was negative.  She is getting ready to fly and would like a refill on her Xanax and does occasionally need Valtrex which her gynecologist gave her.  She uses Ritalin for ADD and has this under very good control.  She is very comfortable with that.  She does not smoke.  She is getting ready to take a trip to [place].  Her work and home life are going quite well.  Most recent fall risk assessment:    05/03/2023    8:53 AM  Fall Risk   Falls in the past year? 0  Number falls in past yr: 0  Risk for fall due to : No Fall Risks  Follow up Falls evaluation completed     Most recent depression screenings:    05/03/2023    8:53 AM 07/14/2021    1:23 PM  PHQ 2/9 Scores  PHQ - 2 Score 0 0    Vision:Within last year    Immunization History  Administered Date(s) Administered  . PFIZER(Purple Top)SARS-COV-2 Vaccination 08/28/2019, 09/18/2019  . Rho (D) Immune Globulin 10/26/2011, 03/16/2013  . Tdap 06/03/2016    Health Maintenance  Topic Date Due  . Cervical Cancer Screening (HPV/Pap Cotest)  05/02/2019  . COVID-19 Vaccine (3 - Pfizer risk series) 05/19/2023 (Originally 10/16/2019)  . INFLUENZA VACCINE  09/06/2023 (Originally 01/07/2023)  . DTaP/Tdap/Td (2 - Td or Tdap) 06/03/2026  .  Colonoscopy  02/20/2030  . Hepatitis C Screening  Completed  . HIV Screening  Completed  . HPV VACCINES  Aged Out    Patient Care Team: Ronnald Nian, MD as PCP - General (Family Medicine)  GYN-Dr Marcelle Overlie Optho-Dr Jimmey Ralph [phone]) Dentist- Dr. Judy Pimple Derm-Dr. Donzetta Starch  Outpatient Medications Prior to Visit  Medication Sig Note  . triamcinolone (NASACORT) 55 MCG/ACT AERO nasal inhaler Place 2 sprays into the nose daily.   . [DISCONTINUED] fluticasone (FLONASE) 50 MCG/ACT nasal spray Place 2 sprays into both nostrils daily.   Marland Kitchen albuterol (VENTOLIN HFA) 108 (90 Base) MCG/ACT inhaler INHALE 2 PUFFS INTO THE LUNGS EVERY 6 HOURS AS NEEDED FOR WHEEZING OR SHORTNESS OF BREATH (Patient not taking: Reported on 07/14/2021) 05/03/2023: As needed  . valACYclovir (VALTREX) 500 MG tablet valacyclovir 500 mg tablet  TAKE 1 TABLET BY MOUTH TWICE DAILY FOR 5 DAYS (Patient not taking: Reported on 07/14/2021) 05/03/2023: As needed  . [DISCONTINUED] ALPRAZolam (XANAX) 0.25 MG tablet Take 1 tablet (0.25 mg total) by mouth 2 (two) times daily as needed for anxiety. (Patient not taking: Reported on 10/28/2022) 05/03/2023: As needed  . [DISCONTINUED] amoxicillin-clavulanate (AUGMENTIN) 875-125 MG tablet Take 1 tablet by mouth 2 (two) times daily.   . [DISCONTINUED] fluconazole (DIFLUCAN)  150 MG tablet Take 150 mg by mouth daily. (Patient not taking: Reported on 02/16/2023)   . [DISCONTINUED] miconazole (MONISTAT 7) 2 % vaginal cream Place 1 Applicatorful vaginally at bedtime. (Patient not taking: Reported on 02/16/2023)   . [DISCONTINUED] nitrofurantoin, macrocrystal-monohydrate, (MACROBID) 100 MG capsule Take 1 capsule (100 mg total) by mouth 2 (two) times daily. (Patient not taking: Reported on 02/16/2023)   . [DISCONTINUED] RITALIN 20 MG tablet Take 1 tablet (20 mg total) by mouth 3 (three) times daily.    No facility-administered medications prior to visit.    Review of Systems  All other systems reviewed and are  negative.   Family and social history as well as health maintenance and immunizations was reviewed.     Objective:    BP 110/60   Pulse 74   Ht 5' 7.5" (1.715 m)   Wt 148 lb 3.2 oz (67.2 kg)   BMI 22.87 kg/m    Physical Exam   Alert and in no distress. Tympanic membranes and canals are normal. Pharyngeal area is normal. Neck is supple without adenopathy or thyromegaly. Cardiac exam shows a regular sinus rhythm without murmurs or gallops. Lungs are clear to auscultation.     Assessment & Plan:     Routine general medical examination at a health care facility - Plan: CBC with Differential/Platelet, Comprehensive metabolic panel, Lipid panel  Hyperhidrosis  Herpes labialis  Seasonal allergic rhinitis due to pollen - Plan: montelukast (SINGULAIR) 10 MG tablet  Attention-deficit hyperactivity disorder, predominantly hyperactive type - Plan: RITALIN 20 MG tablet  Fear of flying - Plan: ALPRAZolam (XANAX) 0.25 MG tablet   Singulair added to her regimen.  Also discussed switching to a different antihistamine for better control and potentially restarting Astelin which she has had in the past.  She will keep me informed concerning this.  Also she will check with her pharmacy to see if they have adequate supplies of Ritalin and I will start to write for a 90-day supply of that as well.     Sharlot Gowda, MD

## 2023-05-04 LAB — LIPID PANEL
Cholesterol, Total: 210 mg/dL — ABNORMAL HIGH (ref 100–199)
HDL: 107 mg/dL (ref >39)
LDL CALC COMMENT:: 2 ratio (ref 0.0–4.4)
LDL Chol Calc (NIH): 92 mg/dL (ref 0–99)
Triglycerides: 60 mg/dL (ref 0–149)
VLDL Cholesterol Cal: 11 mg/dL (ref 5–40)

## 2023-05-04 LAB — CBC WITH DIFFERENTIAL/PLATELET
Basophils Absolute: 0 10*3/uL (ref 0.0–0.2)
Basos: 1 %
EOS (ABSOLUTE): 0.1 10*3/uL (ref 0.0–0.4)
Eos: 3 %
Hematocrit: 40 % (ref 34.0–46.6)
Hemoglobin: 13.3 g/dL (ref 11.1–15.9)
Immature Grans (Abs): 0 10*3/uL (ref 0.0–0.1)
Immature Granulocytes: 0 %
Lymphocytes Absolute: 1.2 10*3/uL (ref 0.7–3.1)
Lymphs: 35 %
MCH: 33.2 pg — ABNORMAL HIGH (ref 26.6–33.0)
MCHC: 33.3 g/dL (ref 31.5–35.7)
MCV: 100 fL — ABNORMAL HIGH (ref 79–97)
Monocytes Absolute: 0.4 10*3/uL (ref 0.1–0.9)
Monocytes: 12 %
Neutrophils Absolute: 1.6 10*3/uL (ref 1.4–7.0)
Neutrophils: 49 %
Platelets: 216 10*3/uL (ref 150–450)
RBC: 4.01 x10E6/uL (ref 3.77–5.28)
RDW: 12.4 % (ref 11.7–15.4)
WBC: 3.3 10*3/uL — ABNORMAL LOW (ref 3.4–10.8)

## 2023-05-04 LAB — COMPREHENSIVE METABOLIC PANEL
ALT: 12 IU/L (ref 0–32)
AST: 17 IU/L (ref 0–40)
Albumin: 4.2 g/dL (ref 3.9–4.9)
Alkaline Phosphatase: 81 IU/L (ref 44–121)
BUN/Creatinine Ratio: 17 (ref 9–23)
BUN: 14 mg/dL (ref 6–24)
Bilirubin Total: 0.5 mg/dL (ref 0.0–1.2)
CO2: 22 mmol/L (ref 20–29)
Calcium: 9.3 mg/dL (ref 8.7–10.2)
Chloride: 102 mmol/L (ref 96–106)
Creatinine, Ser: 0.84 mg/dL (ref 0.57–1.00)
Globulin, Total: 2 g/dL (ref 1.5–4.5)
Glucose: 110 mg/dL — ABNORMAL HIGH (ref 70–99)
Potassium: 4.7 mmol/L (ref 3.5–5.2)
Sodium: 139 mmol/L (ref 134–144)
Total Protein: 6.2 g/dL (ref 6.0–8.5)
eGFR: 85 mL/min/{1.73_m2} (ref >59)

## 2023-07-09 ENCOUNTER — Telehealth: Payer: Self-pay | Admitting: Family Medicine

## 2023-07-09 ENCOUNTER — Other Ambulatory Visit: Payer: Self-pay | Admitting: Medical

## 2023-07-09 DIAGNOSIS — F901 Attention-deficit hyperactivity disorder, predominantly hyperactive type: Secondary | ICD-10-CM

## 2023-07-09 MED ORDER — RITALIN 20 MG PO TABS: 20.0000 mg | ORAL_TABLET | Freq: Three times a day (TID) | ORAL | 0 refills | Status: DC

## 2023-07-09 NOTE — Telephone Encounter (Signed)
Pt needs refill Ritalin  ( Brand name)  [employer]

## 2023-08-25 ENCOUNTER — Other Ambulatory Visit: Payer: Self-pay | Admitting: Family Medicine

## 2023-08-25 DIAGNOSIS — F901 Attention-deficit hyperactivity disorder, predominantly hyperactive type: Secondary | ICD-10-CM

## 2023-08-25 MED ORDER — RITALIN 20 MG PO TABS: 20.0000 mg | ORAL_TABLET | Freq: Three times a day (TID) | ORAL | 0 refills | Status: DC

## 2023-08-25 NOTE — Telephone Encounter (Signed)
 Last apt 05/03/23

## 2023-08-25 NOTE — Telephone Encounter (Signed)
 Copied from CRM [id number]. Topic: Clinical - Medication Refill >> Aug 25, 2023  9:02 AM Everette C wrote: Most Recent Primary Care Visit:  Provider: Ronnald Nian  Department: [facility]  Visit Type: PHYSICAL  Date: 05/03/2023  Medication: RITALIN 20 MG tablet [[id number]] - the patient would like a three month supply   Has the patient contacted their pharmacy? No (Agent: If no, request that the patient contact the pharmacy for the refill. If patient does not wish to contact the pharmacy document the reason why and proceed with request.) (Agent: If yes, when and what did the pharmacy advise?)  Is this the correct pharmacy for this prescription? Yes If no, delete pharmacy and type the correct one.  This is the patient's preferred pharmacy:  [id number] - [place], [place] - [address] AT [facility] OF [address] [place] [address] Phone: [phone] Fax: [phone]  [place] [facility] [address] [place] [address] Phone: [phone] Fax: [phone]   Has the prescription been filled recently? Yes  Is the patient out of the medication? No  Has the patient been seen for an appointment in the last year OR does the patient have an upcoming appointment? Yes  Can we respond through MyChart? No  Agent: Please be advised that Rx refills may take up to 3 business days. We ask that you follow-up with your pharmacy.

## 2023-11-15 ENCOUNTER — Other Ambulatory Visit: Payer: Self-pay | Admitting: Family Medicine

## 2023-11-15 DIAGNOSIS — F901 Attention-deficit hyperactivity disorder, predominantly hyperactive type: Secondary | ICD-10-CM

## 2023-11-15 MED ORDER — RITALIN 20 MG PO TABS: 20.0000 mg | ORAL_TABLET | Freq: Three times a day (TID) | ORAL | 0 refills | Status: DC

## 2023-11-15 NOTE — Telephone Encounter (Signed)
 Copied from CRM [id number]. Topic: Clinical - Medication Refill >> Nov 15, 2023  1:38 PM Kevelyn M wrote: Medication: RITALIN  20 MG tablet  Has the patient contacted their pharmacy? No, patient has to call us  first (Agent: If no, request that the patient contact the pharmacy for the refill. If patient does not wish to contact the pharmacy document the reason why and proceed with request.) (Agent: If yes, when and what did the pharmacy advise?)  This is the patient's preferred pharmacy:  [id number] - [place], [place] - [address] AT [facility] OF [address] & [address] [place]  [address] Phone: [phone] Fax: [phone]   Is this the correct pharmacy for this prescription? Yes If no, delete pharmacy and type the correct one.   Has the prescription been filled recently? No  Is the patient out of the medication? Yes  Has the patient been seen for an appointment in the last year OR does the patient have an upcoming appointment? Yes  Can we respond through MyChart? No  Agent: Please be advised that Rx refills may take up to 3 business days. We ask that you follow-up with your pharmacy.

## 2024-02-16 ENCOUNTER — Other Ambulatory Visit: Payer: Self-pay | Admitting: Family Medicine

## 2024-02-16 DIAGNOSIS — F901 Attention-deficit hyperactivity disorder, predominantly hyperactive type: Secondary | ICD-10-CM

## 2024-02-16 NOTE — Telephone Encounter (Signed)
 FYI Only or Action Required?: Action required by provider: medication refill request.  Patient was last seen in primary care on 05/03/2023 by Joyce Norleen BROCKS, MD.  Called Nurse Triage reporting No chief complaint on file..  Symptoms began today.  Interventions attempted: Nothing.  Symptoms are: stable.  Triage Disposition: No disposition on file.  Patient/caregiver understands and will follow disposition?:

## 2024-02-16 NOTE — Telephone Encounter (Unsigned)
 Copied from CRM #[id number]. Topic: Clinical - Medication Refill >> Feb 16, 2024  2:09 PM Yolanda T wrote: Medication: RITALIN  20 MG tablet  Has the patient contacted their pharmacy? No  This is the patient's preferred pharmacy:  [id number] - [place], [place] - [address] AT [facility] OF [address] [place] [address] Phone: [phone] Fax: [phone]  Is this the correct pharmacy for this prescription? Yes If no, delete pharmacy and type the correct one.   Has the prescription been filled recently? Yes  Is the patient out of the medication? Yes  Has the patient been seen for an appointment in the last year OR does the patient have an upcoming appointment? Yes  Can we respond through MyChart? No  Agent: Please be advised that Rx refills may take up to 3 business days. We ask that you follow-up with your pharmacy.

## 2024-02-17 MED ORDER — RITALIN 20 MG PO TABS: 20.0000 mg | ORAL_TABLET | Freq: Three times a day (TID) | ORAL | 0 refills | Status: DC

## 2024-05-17 ENCOUNTER — Encounter: Payer: Self-pay | Admitting: Family Medicine

## 2024-05-17 ENCOUNTER — Ambulatory Visit: Payer: Self-pay | Admitting: Family Medicine

## 2024-05-17 VITALS — BP 118/74 | HR 73 | Ht 67.0 in | Wt 147.0 lb

## 2024-05-17 DIAGNOSIS — R5383 Other fatigue: Secondary | ICD-10-CM | POA: Diagnosis not present

## 2024-05-17 DIAGNOSIS — Z124 Encounter for screening for malignant neoplasm of cervix: Secondary | ICD-10-CM

## 2024-05-17 DIAGNOSIS — Z0000 Encounter for general adult medical examination without abnormal findings: Secondary | ICD-10-CM | POA: Diagnosis not present

## 2024-05-17 DIAGNOSIS — F901 Attention-deficit hyperactivity disorder, predominantly hyperactive type: Secondary | ICD-10-CM

## 2024-05-17 DIAGNOSIS — Z136 Encounter for screening for cardiovascular disorders: Secondary | ICD-10-CM

## 2024-05-17 DIAGNOSIS — J301 Allergic rhinitis due to pollen: Secondary | ICD-10-CM | POA: Diagnosis not present

## 2024-05-17 MED ORDER — RITALIN 20 MG PO TABS: 20.0000 mg | ORAL_TABLET | Freq: Three times a day (TID) | ORAL | 0 refills | Status: AC

## 2024-05-17 NOTE — Progress Notes (Signed)
 Name: Alexandria Hanna   Date of Visit: 05/17/24   Date of last visit with me: Visit date not found   CHIEF COMPLAINT:  Chief Complaint  Patient presents with   Annual Exam    Cpe. Fasting.        HPI:  Discussed the use of AI scribe software for clinical note transcription with the patient, who gave verbal consent to proceed.  History of Present Illness   Alexandria Hanna is a 48 year old female who presents for management of recurrent sinus infections and medication refills.  She experiences recurrent sinus infections, particularly in the fall, managed with amoxicillin -clavulanate (Augmentin ) as it is the only effective antibiotic for her. Z-Paks are ineffective. These infections typically occur between November and Christmas. She uses Nasacort regularly but does not take Claritin daily.  She has been on Ritalin  for most of her life, taking it three times a day. She requires the brand name medication as the generic is not effective for her. There was a period when it was difficult to obtain due to increased demand during COVID-19, but this issue has since resolved.  She takes Valtrex  for shingles, which manifests as a rash on her leg and occasionally on her back. She has not yet received the shingles vaccine.  She has been through menopause and has not had a period in two years. She has been using NAD supplements and peptides for the past two months, which she feels have helped her feel more like herself. She reports a history of significant stress, which she believes contributed to hip pain that has since improved with dry needling therapy.  She recently had a mammogram and her PAP smears are up to date. She has no history of abnormal PAP smears and her OB/GYN is at [facility]. She is a scientist, research (physical sciences), plays tennis regularly, and is married with three sons. She is planning a trip to [place] and [place] with her family.         OBJECTIVE:       05/03/2023    8:53  AM  Depression screen PHQ 2/9  Decreased Interest 0  Down, Depressed, Hopeless 0  PHQ - 2 Score 0     BP Readings from Last 3 Encounters:  05/17/24 118/74  05/03/23 110/60  10/28/22 120/60    BP 118/74   Pulse 73   Ht 5' 7 (1.702 m)   Wt 147 lb (66.7 kg)   SpO2 99%   BMI 23.02 kg/m    Physical Exam          Physical Exam Constitutional:      Appearance: Normal appearance.  Neurological:     General: No focal deficit present.     Mental Status: She is alert and oriented to person, place, and time. Mental status is at baseline.     ASSESSMENT/PLAN:   Assessment & Plan Annual physical exam  Attention-deficit hyperactivity disorder, predominantly hyperactive type  Seasonal allergic rhinitis due to pollen  Encounter for screening for cardiovascular disorders  Cervical cancer screening  Other fatigue    Assessment and Plan    Attention-deficit hyperactivity disorder, predominantly hyperactive type Long-standing ADHD managed with brand name Ritalin  for 20 years. Requires brand name due to previous issues with generic versions. - Called in prescription for brand name Ritalin  to [facility] for a three-month supply.  Seasonal allergic rhinitis Recurrent sinus infections managed with amoxicillin . Discussed benefit of daily Claritin to prevent sinus infections by  reducing sinus congestion. - Start daily Claritin to prevent sinus infections.  Menopausal syndrome Postmenopausal symptoms managed with NAD supplements and peptides. Discussed potential benefits and risks of estrogen replacement therapy (ERT). She expressed interest in trying ERT after returning from travel. - Will schedule appointment after return from travel to discuss and initiate estrogen replacement therapy.  General Health Maintenance Mammogram completed. PAP smear not performed this year due to lack of abnormal history. Discussed shingles vaccination and creatine supplementation. - Will  administer first dose of shingles vaccine after return from travel. - Continue annual mammograms and PAP smears as per guidelines. - Recommended creatine supplementation with 5 grams of powder daily.  -Comprehensive annual physical exam completed today. Reviewed interval history, current medical issues, medications, allergies, and preventive care needs. Addressed all patient questions and concerns. Discussed lifestyle factors including diet, exercise, sleep, and stress management. Reviewed recommended age-appropriate screenings, labs, and vaccinations. Counseling provided on healthy habits and routine health maintenance. Follow-up as indicated based on findings and results.         Reichen Hutzler A. Vita MD [facility]

## 2024-05-18 ENCOUNTER — Ambulatory Visit: Payer: Self-pay | Admitting: Family Medicine

## 2024-05-18 LAB — LIPID PANEL
Chol/HDL Ratio: 2.6 ratio (ref 0.0–4.4)
Cholesterol, Total: 210 mg/dL — ABNORMAL HIGH (ref 100–199)
HDL: 82 mg/dL (ref >39)
LDL Chol Calc (NIH): 114 mg/dL — ABNORMAL HIGH (ref 0–99)
Triglycerides: 79 mg/dL (ref 0–149)
VLDL Cholesterol Cal: 14 mg/dL (ref 5–40)

## 2024-05-18 LAB — COMPREHENSIVE METABOLIC PANEL WITH GFR
ALT: 11 IU/L (ref 0–32)
AST: 17 IU/L (ref 0–40)
Albumin: 4.8 g/dL (ref 3.9–4.9)
Alkaline Phosphatase: 75 IU/L (ref 41–116)
BUN/Creatinine Ratio: 18 (ref 9–23)
BUN: 14 mg/dL (ref 6–24)
Bilirubin Total: 0.3 mg/dL (ref 0.0–1.2)
CO2: 25 mmol/L (ref 20–29)
Calcium: 9.5 mg/dL (ref 8.7–10.2)
Chloride: 100 mmol/L (ref 96–106)
Creatinine, Ser: 0.78 mg/dL (ref 0.57–1.00)
Globulin, Total: 2 g/dL (ref 1.5–4.5)
Glucose: 88 mg/dL (ref 70–99)
Potassium: 4.2 mmol/L (ref 3.5–5.2)
Sodium: 138 mmol/L (ref 134–144)
Total Protein: 6.8 g/dL (ref 6.0–8.5)
eGFR: 92 mL/min/1.73 (ref >59)

## 2024-05-18 LAB — VITAMIN D 25 HYDROXY (VIT D DEFICIENCY, FRACTURES): Vit D, 25-Hydroxy: 54.3 ng/mL (ref 30.0–100.0)

## 2024-05-18 LAB — CBC WITH DIFFERENTIAL/PLATELET
Basophils Absolute: 0 x10E3/uL (ref 0.0–0.2)
Basos: 1 %
EOS (ABSOLUTE): 0.1 x10E3/uL (ref 0.0–0.4)
Eos: 2 %
Hematocrit: 40.6 % (ref 34.0–46.6)
Hemoglobin: 14 g/dL (ref 11.1–15.9)
Immature Grans (Abs): 0 x10E3/uL (ref 0.0–0.1)
Immature Granulocytes: 0 %
Lymphocytes Absolute: 1.4 x10E3/uL (ref 0.7–3.1)
Lymphs: 37 %
MCH: 34.7 pg — ABNORMAL HIGH (ref 26.6–33.0)
MCHC: 34.5 g/dL (ref 31.5–35.7)
MCV: 101 fL — ABNORMAL HIGH (ref 79–97)
Monocytes Absolute: 0.4 x10E3/uL (ref 0.1–0.9)
Monocytes: 10 %
Neutrophils Absolute: 2 x10E3/uL (ref 1.4–7.0)
Neutrophils: 50 %
Platelets: 208 x10E3/uL (ref 150–450)
RBC: 4.04 x10E6/uL (ref 3.77–5.28)
RDW: 12.1 % (ref 11.7–15.4)
WBC: 3.9 x10E3/uL (ref 3.4–10.8)
